# Patient Record
Sex: Male | Born: 1975
Health system: Southern US, Community
[De-identification: ages and names within clinical notes are randomized; demographics above are authoritative.]

## PROBLEM LIST (undated history)

## (undated) DIAGNOSIS — M109 Gout, unspecified: Secondary | ICD-10-CM

## (undated) DIAGNOSIS — M1990 Unspecified osteoarthritis, unspecified site: Secondary | ICD-10-CM

## (undated) DIAGNOSIS — E119 Type 2 diabetes mellitus without complications: Secondary | ICD-10-CM

## (undated) DIAGNOSIS — I10 Essential (primary) hypertension: Secondary | ICD-10-CM

## (undated) DIAGNOSIS — K5792 Diverticulitis of intestine, part unspecified, without perforation or abscess without bleeding: Secondary | ICD-10-CM

---

## 2005-02-03 ENCOUNTER — Emergency Department (HOSPITAL_COMMUNITY): Admission: EM | Admit: 2005-02-03 | Discharge: 2005-02-03 | Payer: Self-pay | Admitting: Emergency Medicine

## 2005-02-06 ENCOUNTER — Inpatient Hospital Stay (HOSPITAL_COMMUNITY): Admission: EM | Admit: 2005-02-06 | Discharge: 2005-02-10 | Payer: Self-pay | Admitting: Emergency Medicine

## 2005-02-11 ENCOUNTER — Ambulatory Visit: Payer: Self-pay | Admitting: Internal Medicine

## 2005-02-25 ENCOUNTER — Ambulatory Visit: Payer: Self-pay | Admitting: *Deleted

## 2006-07-17 ENCOUNTER — Emergency Department (HOSPITAL_COMMUNITY): Admission: EM | Admit: 2006-07-17 | Discharge: 2006-07-17 | Payer: Self-pay | Admitting: Emergency Medicine

## 2006-12-21 IMAGING — CT CT ABDOMEN W/O CM
2 of 3 series · 17 of 46 positions shown, 19 images · IV contrast (agent unspecified)
Comparison: none

CLINICAL DATA: Left flank pain radiating to groin.  Suspect ureteral calculus or renal obstruction.
 ABDOMEN CT WITHOUT CONTRAST:
TECHNIQUE: Multidetector CT imaging of the abdomen was performed following the standard protocol without IV contrast.
TECHNIQUE: Multidetector CT imaging of the pelvis was performed following the standard protocol without IV contrast.

[Series 2: stone_wo 5.0 b40f st · axial · 0.67mm/px · z∈[-460,-84]mm · 14 of 108 slices shown, 16 images]
[im 7/108  soft-tissue]
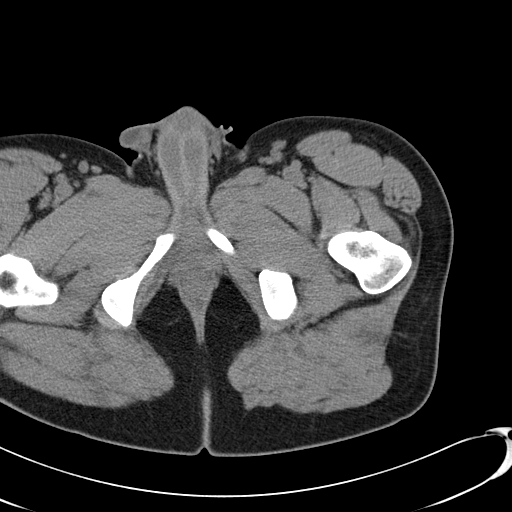
[im 7/108  bone]
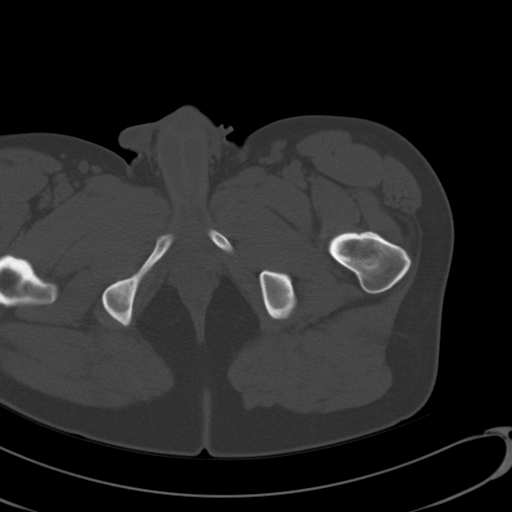
[im 14/108  soft-tissue]
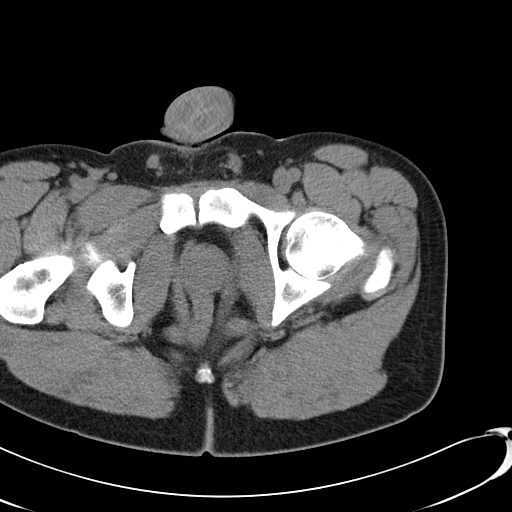
[im 21/108  soft-tissue]
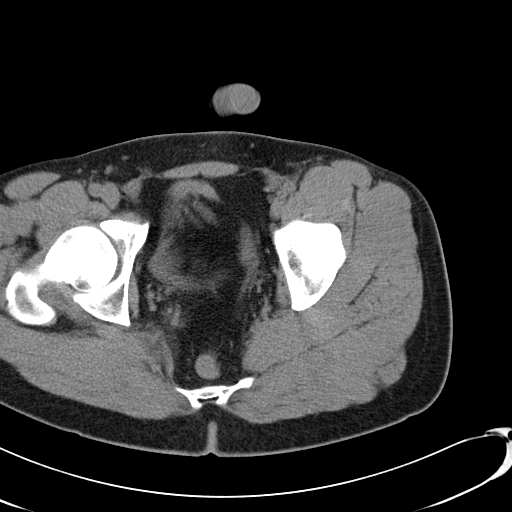
[im 28/108  soft-tissue]
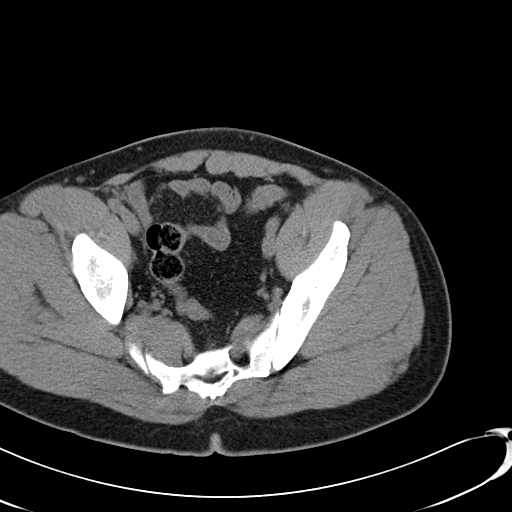
[im 35/108  soft-tissue]
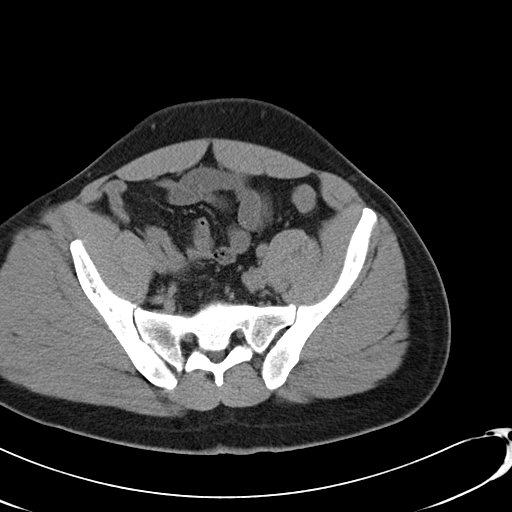
[im 42/108  soft-tissue]
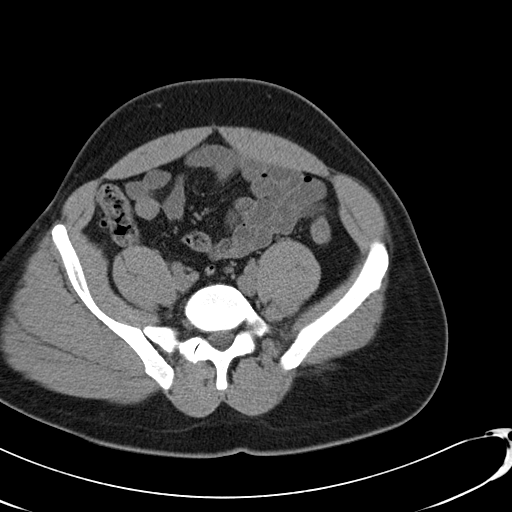
[im 49/108  soft-tissue]
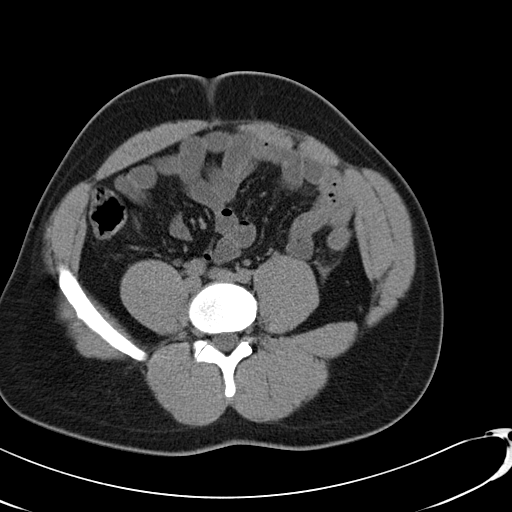
[im 59/108  soft-tissue]
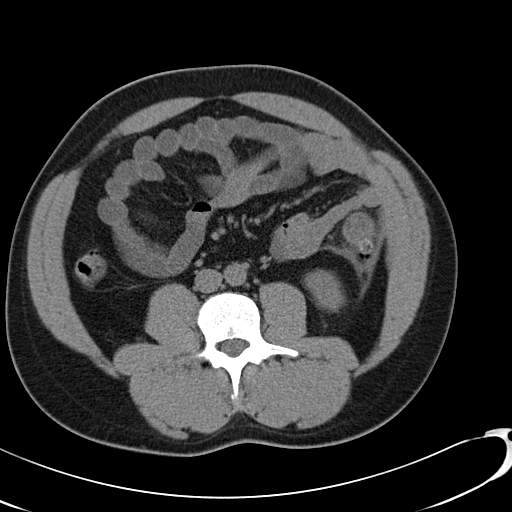
[im 66/108  soft-tissue]
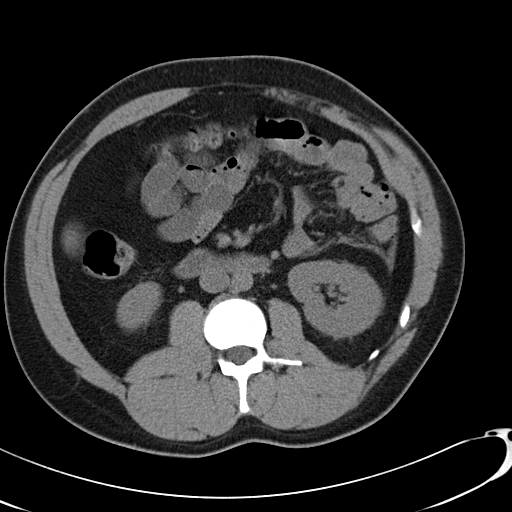
[im 66/108  bone]
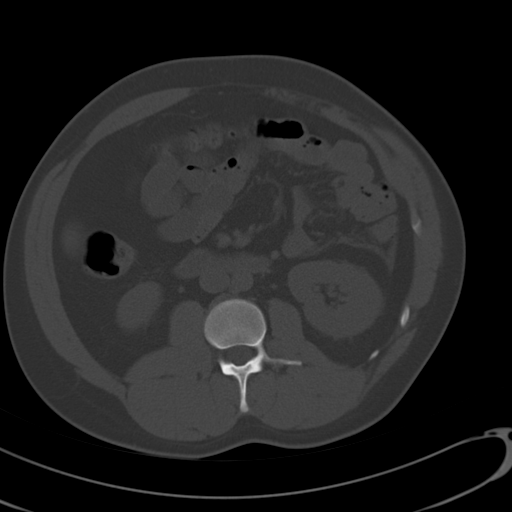
[im 73/108  soft-tissue]
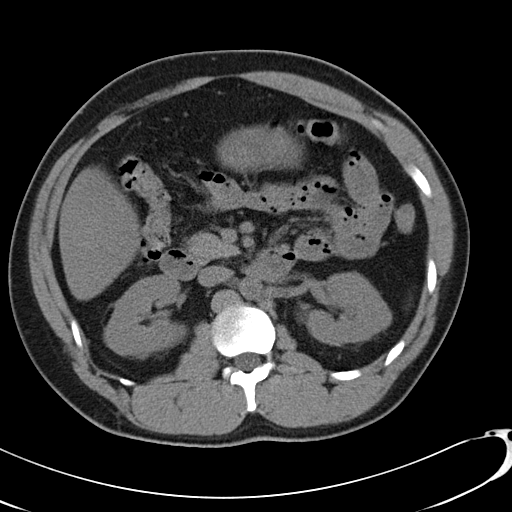
[im 80/108  soft-tissue]
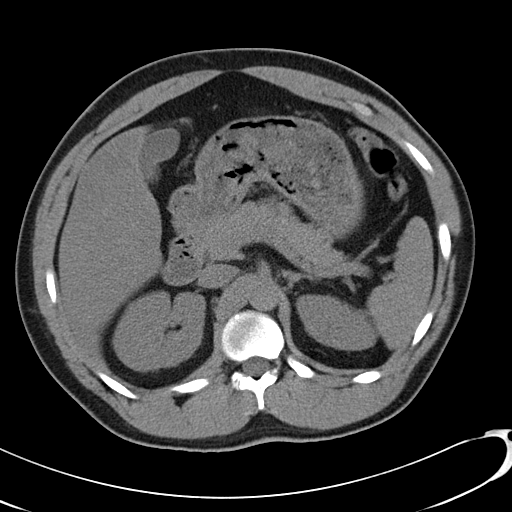
[im 87/108  soft-tissue]
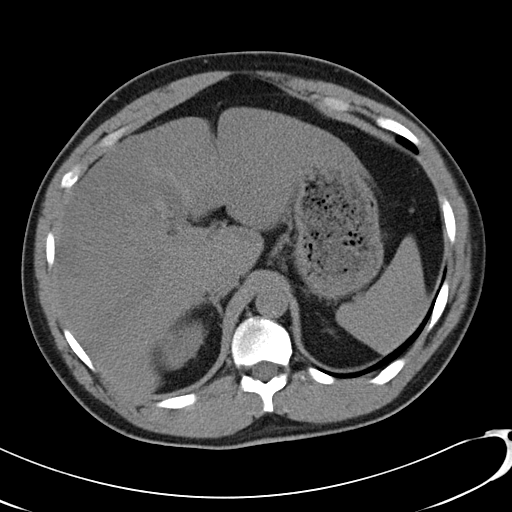
[im 94/108  soft-tissue]
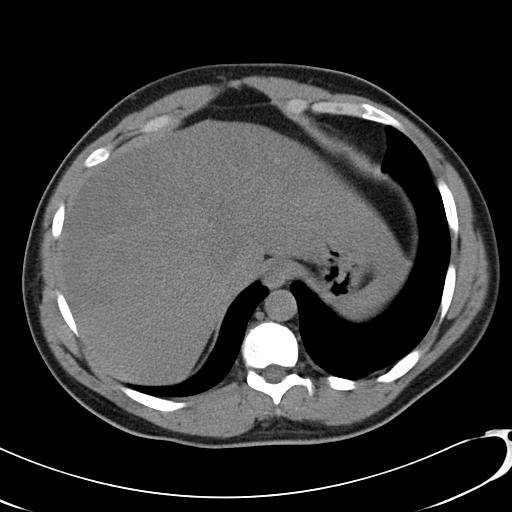
[im 101/108  soft-tissue]
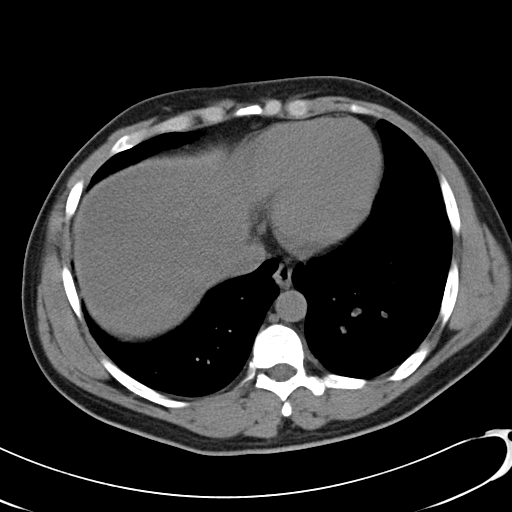

[Series 602: cor · coronal · 0.88mm/px · 3 of 50 slices shown]
[im 17/50  soft-tissue]
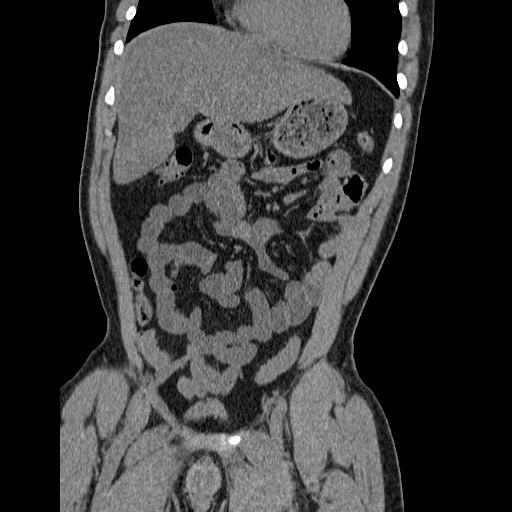
[im 22/50  soft-tissue]
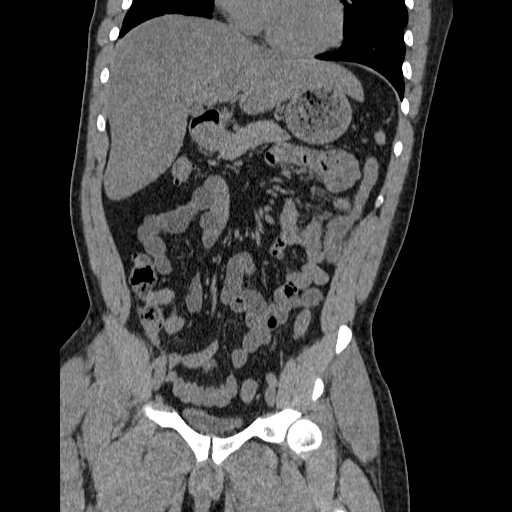
[im 28/50  soft-tissue]
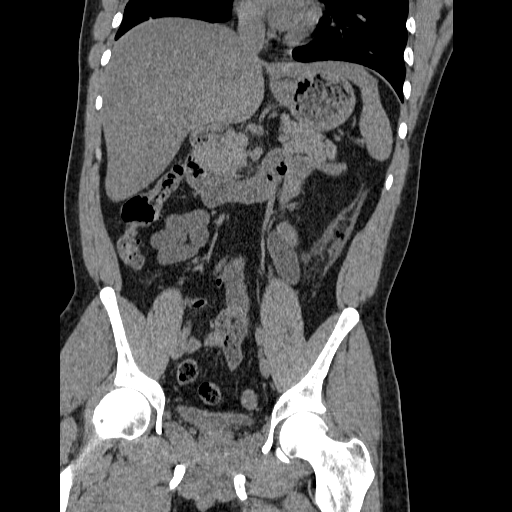

[17 of 46 positions shown; findings below may reference images not displayed]

FINDINGS: There is no evidence of renal calculi or hydronephrosis.  Both kidneys are normal in contour and there is no evidence of perinephric fluid or inflammatory changes.  Diffuse fatty infiltration of the liver is noted.  Other abdominal parenchymal organs are unremarkable in appearance on this non-contrast study.  
 Diverticulosis of the sigmoid colon is noted and there is moderate pericolonic inflammatory change along the course of the descending colon.  These findings are consistent with diverticulitis.  There is no evidence of abscess or free fluid.
IMPRESSION: 1.  Mild to moderate diverticulitis of the descending colon.  No evidence of abscess.  
 2.  No evidence for renal calculi or hydronephrosis.  
 3.  Diffuse fatty infiltration of the liver.  
 PELVIS CT WITHOUT CONTRAST:
FINDINGS: There is no evidence of ureteral calculi or dilatation.  No bowel wall thickening or inflammatory changes are seen in the pelvis.  There is no evidence of mass or adenopathy within the pelvis.
IMPRESSION: Negative non-contrast pelvis CT.  No evidence of ureteral calculi.  See abnormal abdomen CT report above.

## 2009-08-20 ENCOUNTER — Emergency Department (HOSPITAL_COMMUNITY): Admission: EM | Admit: 2009-08-20 | Discharge: 2009-08-20 | Payer: Self-pay | Admitting: Emergency Medicine

## 2010-07-11 NOTE — Consult Note (Signed)
NAMEKENDEL, BESSEY NO.:  [id number]   MEDICAL RECORD NO.:  [id number]          PATIENT TYPE:  INP   LOCATION:  1611                         FACILITY:  [facility]   PHYSICIAN:  Angelia Mould. Derrell Lolling, M.D.DATE OF BIRTH:  Feb 22, 1976   DATE OF CONSULTATION:  02/08/2005  DATE OF DISCHARGE:                                   CONSULTATION   REASON FOR CONSULTATION:  Left-sided colitis.   HISTORY OF PRESENT ILLNESS:  This is a 35 year old African-American man who  developed left-sided abdominal pain 5 days ago. He was seen in the emergency  room on December12, had a CT scan and was given a prescription for Cipro and  Flagyl and oxycodone and sent home. He states the pain persisted. He  continued to have nausea and vomiting. He continues to have nonbloody  stools. He was admitted on February 06, 2005 and placed on IV Cipro and  Flagyl. A CT scan was done yesterday which shows focal inflammatory change  in the distal descending colon with a small collection of extraluminal air  in the area but there is no abscess and no obstruction. He has remained  afebrile throughout his hospital course. He denies having any prior similar  episodes and denies any prior gastrointestinal complaints.   PAST HISTORY:  Asthma but no episodes in 10 years.   FAMILY HISTORY:  Father has a seizure problem, mother recently had a total  hip replacement, three brothers, three sisters, all healthy.   CURRENT MEDICATIONS:  Cipro and Flagyl.   ALLERGIES:  OXYCODONE   SOCIAL HISTORY:  He drinks an 18 pack on the weekend, smokes an ounce of  marijuana per week and smokes 1-2 cigarettes per day. Denies any intravenous  drugs. Denies any history of HIV, works as a Recruitment consultant.   REVIEW OF SYSTEMS:  All systems reviewed and noncontributory except as  described above.   PHYSICAL EXAM:  GENERAL:  Pleasant, thin, healthy-appearing young African-  American male in no distress.  VITAL SIGNS:   Temperature 98.7,  blood pressure 137/89, heart rate 63 and  regular, respiratory rate 18.  EYES:  Sclera clear. Extraocular movements intact.  ENT:  Nose, lips, tongue and oropharynx are without gross lesions. NECK:  Supple, nontender. No mass. No jugular distension.  LUNGS:  Clear to auscultation. No chest wall tenderness.  HEART:  Regular rate and rhythm. No murmur. Radial and femoral pulses are  palpable. No peripheral edema.  ABDOMEN:  Soft, not distended. He has localized tenderness in the left lower  quadrant actually more lateral in the left flank but there is no mass, and  no guarding. There is no hernia noted. Liver and spleen not enlarged.  GENITOURINARY:  Penis, scrotum and testes are normal. EXTREMITIES:  Reveals  all four extremities well without pain or deformity.  NEUROLOGIC:  No gross motor sensory deficits.   LABORATORY WORK:  White blood cell count was 13,000 on admission and is now  down to 8200, hemoglobin 12.8 Chest x-ray shows no active disease. Liver  function test normal.   ASSESSMENT:  1.  Presumed acute diverticulitis of the  distal descending colon. Other      etiologies including cancer, inflammatory bowel disease, and foreign      body perforation are possible, felt to be much less likely.  2.  He seems to be slowly responding to medical therapy with bowel rest and      IV antibiotics.   PLAN:  I would continue nonoperative management at this point since there  does not appear to be any surgical complication. I would continue bowel  rest, n.p.o. except for sips. IV antibiotics. I will plan to follow-up  colonoscopy in about 1 month.      Angelia Mould. Derrell Lolling, M.D.  Electronically Signed     HMI/MEDQ  D:  02/08/2005  T:  02/10/2005  Job:  [id number]   cc:   Hettie Holstein, D.O.

## 2010-07-11 NOTE — H&P (Signed)
NAMEVEER, ELAMIN            ACCOUNT NO.:  [id number]   MEDICAL RECORD NO.:  [id number]          PATIENT TYPE:  INP   LOCATION:  1611                         FACILITY:  [facility]   PHYSICIAN:  Jonna L. Robb Matar, M.D.DATE OF BIRTH:  30-May-1975   DATE OF ADMISSION:  02/06/2005  DATE OF DISCHARGE:                                HISTORY & PHYSICAL   CHIEF COMPLAINT:  Vomiting.   PRIMARY CARE PHYSICIAN:  Unassigned.   HISTORY:  This 35 year old African American male has had a three-day history  of abdominal pain.  He was seen here, on February 03, 2005, with left lower  quadrant pain, put on Cipro, Flagyl, and oxycodone.  He notes he was  allergic to the oxycodone because he took it and he broke out in hives.  The  nausea and vomiting restarted yesterday and the pain is no better at all.  He has had non-bloody vomitus.  No hematemesis, melena, or bright red blood.  He has vomited about 5-6 times in the last 24 hours.  Ate corn on the cob  for the couple of days prior to this episode.  By now, he is noticing that  he is getting weak and his urine is a getting darker color.  In the  emergency room, he has received Dilaudid with Zofran.   PAST MEDICAL HISTORY:  Asthma, but he has not had any episodes in over 10  years.   FAMILY HISTORY:  His father has a seizure problem.  His mother has recently  had a total hip replacement.  He has three brothers, three sisters are all  healthy.   OPERATIONS:  None.   SOCIAL HISTORY:  He drinks an 18-pack on weekends.  Smokes an ounce of  marijuana per week.  He smokes 1-2 cigarettes per day.  He works as a  Recruitment consultant.   ALLERGIES:  1.  OXYCODONE.  2.  ALUPENT.   MEDICATIONS:  1.  Cipro 500 b.i.d.  2.  Flagyl 500 b.i.d.   REVIEW OF SYSTEMS:  As noted he has been thirsty, a little bit weak, dark  urine.  Denies any coughing, wheezing, chest pain, seizures, headaches,  blurred vision, kidney problems, no edema.   PHYSICAL  EXAMINATION:  VITAL SIGNS:  Temp 99.5, pulse 93, respirations 20,  blood pressure 130/85.  GENERAL:  A well developed male in moderate abdominal pain.  HEENT:  Conjunctivae and lids are normal.  Pupils reactive, slightly  constricted.  Extraocular movements are full.  Normal hearing, mucosa, and  pharynx.  There is no mass, thyromegaly, carotid bruits.  RESPIRATORY:  Effort is normal.  The lungs are clear to A&P without  wheezing, rales, rhonchi, or dullness.  HEART:  Regular rate and rhythm.  Normal S1 and S2 without murmurs, rubs, or  gallops.  There are no bruits, cyanosis, clubbing, or edema.  ABDOMEN:  Very tender in the left lower quadrant with some guarding.  No  rebound.  No hepatosplenomegaly or hernia.  Bowel sounds are positive.  GENITOURINARY:  Normal scrotum and penis.  LYMPHATICS:  No cervical or inguinal adenopathy.  MUSCULOSKELETAL:  Muscle  strength is 5/5 with full range of motion all four  extremities.  SKIN:  Shows no rash, lesions, or nodules.  NEUROLOGIC:  Cranial nerves are intact.  DTRs are 3+ and equal.  Normal  sensation.  He is alert and oriented x3.  Normal memory, judgment, and  affect.   LABORATORY WORK:  Shows a white count of 13,000, 87% neutrophils.  Sodium  132, BUN 12, creatinine 1.1, albumin 3.3.  Normal LFTs.  Chest x-ray:  No  acute disease.  CT, from February 03, 2005, shows moderate diverticulitis in  the descending colon, fatty infiltrate in the liver.  Pelvic CT was  negative.   1.  Acute diverticulitis.  I am going to put him on Primaxin and Flagyl,      NPO, intravenous fluids, and recheck his CT in the morning to rule out      abscess.  2.  Fatty liver.  This is probably secondary to alcohol.  I discussed      cessation with the patient.  3.  Marijuana abuse.      Jonna L. Robb Matar, M.D.  Electronically Signed     JLB/MEDQ  D:  02/06/2005  T:  02/06/2005  Job:  [id number]

## 2010-07-11 NOTE — Discharge Summary (Signed)
Howard Bird, Howard Bird            ACCOUNT NO.:  [id number]   MEDICAL RECORD NO.:  [id number]          PATIENT TYPE:  INP   LOCATION:  1611                         FACILITY:  [facility]   PHYSICIAN:  Mobolaji B. Bakare, M.D.DATE OF BIRTH:  02-08-1976   DATE OF ADMISSION:  02/06/2005  DATE OF DISCHARGE:  02/10/2005                                 DISCHARGE SUMMARY   PRIMARY CARE PHYSICIAN:  Unassigned.  The patient will be following up at  [facility].   CONSULTS:  Surgical consult, Dr. Derrell Lolling.   FINAL DIAGNOSES:  1.  Acute diverticulitis involving distal descending colon.  The patient      will require follow-up colonoscopy in 6 weeks.  2.  Fatty liver, probably secondary to alcohol abuse.  The patient was      counseled on cessation.  3.  Marijuana abuse.   SECONDARY DIAGNOSIS:  History of asthma.   PROCEDURES:  1.  Abdominal x-ray showed no acute findings.  2.  CT scan of the abdomen done on February 07, 2005 showed mild worsening      of descending colon diverticulitis, with new extraluminal air bubbles      noted.  No evidence of drainable abscess.  3.  Pelvic CT scan with contrast showed diverticulosis in the sigmoid colon      without evidence of diverticulitis.  No evidence of inflammatory process      or abnormal fluid collections.   BRIEF HISTORY:  Howard Bird is a 35 year old, African-American male who  initially presented to the emergency department on February 03, 2005 with  left lower quadrant pain.  He was then put on Cipro, Flagyl, and oxycodone,  and for 2 days he could not tolerate these antibiotics.  He had nausea,  vomiting, and this was probably secondary to the oxycodone.  In any case, he  re-presented to the emergency room after having failed outpatient treatment  for mild to moderate diverticulitis involving the descending colon which was  noted on CT scan of abdomen done on February 03, 2005.   On reevaluation in the emergency room on February 06, 2005,  the patient  had  a low-grade temperature of 99.5; pulse of 93; and blood pressure of 130/85.  Hence, he was admitted for IV antibiotics.   HOSPITAL COURSE:  He was empirically started on Primaxin and Flagyl IV.  He  was placed on IV fluid.  Surgical consult was obtained, and it was felt that  conservative management is appropriate.  White cell count improved from  13,000 to 8.2.  The patient remained afebrile.  The patient improved  clinically, and was having loose stools without blood.  He was started on  clear liquids and subsequently advanced as tolerated.  He tolerated his full  liquid diet.  He will be tried today on a low-residue diet, and if the  patient continues to tolerate it he will be discharged home to follow up at  [facility].   CONDITION AT DISCHARGE:  Clinically improved and stable.  There was no  abdominal pain or tenderness.   DISCHARGE MEDICATIONS:  1.  Ciprofloxacin 500  mg b.i.d. to complete antibiotics for 10 days.  2.  Flagyl 500 mg t.i.d. to complete antibiotics for 10 days.  3.  Tylenol p.r.n. for pain.  4.  Alupent inhaler p.r.n.  5.  Phenergan 12.5 mg q.4 h. p.r.n. as needed for nausea.   FOLLOWUP:  At [facility].   RECOMMENDATIONS:  Colonoscopy in 6 weeks.      Mobolaji B. Corky Downs, M.D.  Electronically Signed     MBB/MEDQ  D:  02/10/2005  T:  02/10/2005  Job:  [id number]   cc:   Angelia Mould. Derrell Lolling, M.D.  [address]  [place]  [place] [address]

## 2011-09-28 ENCOUNTER — Encounter (HOSPITAL_COMMUNITY): Payer: Self-pay | Admitting: *Deleted

## 2011-09-28 ENCOUNTER — Emergency Department (HOSPITAL_COMMUNITY)
Admission: EM | Admit: 2011-09-28 | Discharge: 2011-09-28 | Disposition: A | Discharge status: Home / Self Care | Payer: Self-pay | Attending: Emergency Medicine | Admitting: Emergency Medicine

## 2011-09-28 DIAGNOSIS — F17200 Nicotine dependence, unspecified, uncomplicated: Secondary | ICD-10-CM | POA: Insufficient documentation

## 2011-09-28 DIAGNOSIS — Z885 Allergy status to narcotic agent status: Secondary | ICD-10-CM | POA: Insufficient documentation

## 2011-09-28 DIAGNOSIS — M659 Synovitis and tenosynovitis, unspecified: Secondary | ICD-10-CM | POA: Insufficient documentation

## 2011-09-28 DIAGNOSIS — M7750 Other enthesopathy of unspecified foot: Secondary | ICD-10-CM

## 2011-09-28 DIAGNOSIS — M65979 Unspecified synovitis and tenosynovitis, unspecified ankle and foot: Secondary | ICD-10-CM | POA: Insufficient documentation

## 2011-09-28 HISTORY — DX: Diverticulitis of intestine, part unspecified, without perforation or abscess without bleeding: K57.92

## 2011-09-28 MED ORDER — TRAMADOL HCL 50 MG PO TABS: 50.0000 mg | ORAL_TABLET | Freq: Four times a day (QID) | ORAL | Status: AC | PRN

## 2011-09-28 MED ORDER — KETOROLAC TROMETHAMINE 60 MG/2ML IM SOLN
60.0000 mg | Freq: Once | INTRAMUSCULAR | Status: AC
  Administered 2011-09-28: 60 mg via INTRAMUSCULAR
  Filled 2011-09-28: qty 2

## 2011-09-28 NOTE — ED Provider Notes (Signed)
History     CSN: [id number]  Arrival date & time 09/28/11  0543   First MD Initiated Contact with Patient 09/28/11 [phone]      Chief Complaint  Patient presents with  . Ankle Pain    (Consider location/radiation/quality/duration/timing/severity/associated sxs/prior treatment) HPI Comments: Howard Bird 36 y.o. male   The chief complaint is: Patient presents with:   Ankle Pain   The patient has medical history significant for:   Past Medical History:   Diverticulitis                                              The onset of the symptoms was  gradual starting 10 hours ago,  The Course is  persistent, gradually worsened Movement, walking, pressure, palpation makes symptoms worse, rest makes symptoms better Has associated swelling Denies denies headache, changes in vision, CP, SOB, abdominal pain.       The history is provided by the patient, the spouse and medical records. History Limited By: none.   Howard Bird is a 36 y.o. male presents to the emergency room c/o 12 hrs of severe R ankle pain.  States he drove 1200 miles in the past 2 days without  cruise control.  Arrived home Sunday afternoon and his ankle was sore, but he was able to walk and use it.   The pain became intense overnight and he is currently unable to weight bear 2/2 pain.  He has associated swelling in the posterior R ankle.  The pain is throbbing, isolated to the ankle without radiation and rated at a 10/10.  He denies trauma, falling or other possible injury.  He also denies fever, chills, headache, changes in vision, CP, SOB, abdominal pain.  He has no Hx of clotting disorders or problems.  He has tried an ace bandage without relief.    Past Medical History  Diagnosis Date  . Diverticulitis     History reviewed. No pertinent past surgical history.  History reviewed. No pertinent family history.  History  Substance Use Topics  . Smoking status: Current Everyday Smoker -- 0.5 packs/day  .  Smokeless tobacco: Not on file  . Alcohol Use: Yes      Review of Systems  Respiratory: Negative for chest tightness, shortness of breath and wheezing.   Cardiovascular: Negative for chest pain.  Musculoskeletal: Positive for joint swelling and gait problem. Negative for back pain.  Skin: Negative for rash and wound.  Neurological: Negative for numbness.    Allergies  Oxycontin  Home Medications   Current Outpatient Rx  Name Route Sig Dispense Refill  . NAPROXEN SODIUM 220 MG PO TABS Oral Take 220 mg by mouth 2 (two) times daily as needed. For pain      BP 148/110  Pulse 108  Temp 97.8 F (36.6 C) (Oral)  Resp 16  SpO2 98%  Physical Exam  Nursing note and vitals reviewed. Constitutional: He appears well-developed and well-nourished. No distress.  HENT:  Head: Normocephalic and atraumatic.  Eyes: Conjunctivae are normal.  Cardiovascular: Normal rate, regular rhythm and intact distal pulses.        Capillary refill less than 3 seconds Intact distal pulses  Pulmonary/Chest: Effort normal and breath sounds normal.  Musculoskeletal: He exhibits edema (in the posterior ankle) and tenderness.       Right ankle: He exhibits decreased range of motion and  swelling. He exhibits no ecchymosis, no deformity, no laceration and normal pulse. Achilles tendon exhibits pain. Achilles tendon exhibits no defect.       ROM: Significantly decreased secondary to pain  Neurological: He is alert. Coordination normal.       Sensation intact Strength decreased in R ankle secondary to pain  Skin: Skin is warm and dry. No rash noted. He is not diaphoretic. No erythema. No pallor.       No erythema or increased warmth    ED Course  Procedures (including critical care time)  Labs Reviewed - No data to display No results found.   1. Tendonitis of ankle       MDM  Dave Mergen presents after a long trip with ankle pain.  Based on his extensive driving in a short period of time and  the lack of injury I believe this is a tendonitis.  He has no symptoms of a blood clot or PE.  I believe he has strained his solius muscle or gastroc muscle.  I have recommended NSAIDs during the day and I will prescribe Norco at night.  I will also give crutches so that he can attend work today.  Follow-up with the ER if swelling extends up the the leg or he develops shortness of breath.  I have discussed indications to return immediately to the ER and they have expressed understanding.    1. Medications: Ultram 2. Treatment: NSAIDs during the day, Ultram at night 3. Follow Up: with ortho or ED if symptoms persist        Dierdre Forth, PA-C 09/28/11 0730

## 2011-09-28 NOTE — ED Notes (Addendum)
Recently drove to [place] and back over 2d, returned yesterday, c/o R ankle pain, denies injury, "thinks the continual flexion/extension of ankle inflamed area", pain onset 1500 yesterday, worse since , "now cannot walk on it". CMS intact. Pinpoints pain from R fore foot to lower shin/calf

## 2011-09-28 NOTE — ED Provider Notes (Signed)
Medical screening examination/treatment/procedure(s) were conducted as a shared visit with non-physician practitioner(s) and myself.  I personally evaluated the patient during the encounter  Pt with normal vitals, did drive a long distance to [place] and back within 72 hours, has pain isolated to insertion of gastroc and soleus muscles of right lower leg in posterior area only, non visible edema, not pitting, good pulse, no rash, tender along tendon only suggesting severe tendonitis after overuse from pushing on accelerator and brake.  NSAIDs, analgesics, RICE.  Crutches for helping to ambulate.    Gavin Pound. Cleon Signorelli, MD 09/28/11 4098

## 2014-01-03 ENCOUNTER — Emergency Department (HOSPITAL_COMMUNITY)
Admission: EM | Admit: 2014-01-03 | Discharge: 2014-01-03 | Disposition: A | Discharge status: Home / Self Care | Payer: 59 | Attending: Emergency Medicine | Admitting: Emergency Medicine

## 2014-01-03 ENCOUNTER — Emergency Department (HOSPITAL_COMMUNITY): Payer: 59

## 2014-01-03 ENCOUNTER — Encounter (HOSPITAL_COMMUNITY): Payer: Self-pay | Admitting: *Deleted

## 2014-01-03 DIAGNOSIS — Z8719 Personal history of other diseases of the digestive system: Secondary | ICD-10-CM | POA: Insufficient documentation

## 2014-01-03 DIAGNOSIS — Z720 Tobacco use: Secondary | ICD-10-CM | POA: Diagnosis not present

## 2014-01-03 DIAGNOSIS — M79671 Pain in right foot: Secondary | ICD-10-CM

## 2014-01-03 DIAGNOSIS — M25462 Effusion, left knee: Secondary | ICD-10-CM

## 2014-01-03 DIAGNOSIS — M25474 Effusion, right foot: Secondary | ICD-10-CM | POA: Diagnosis not present

## 2014-01-03 DIAGNOSIS — Z791 Long term (current) use of non-steroidal anti-inflammatories (NSAID): Secondary | ICD-10-CM | POA: Insufficient documentation

## 2014-01-03 DIAGNOSIS — M25562 Pain in left knee: Secondary | ICD-10-CM | POA: Diagnosis present

## 2014-01-03 MED ORDER — MELOXICAM 7.5 MG PO TABS: ORAL_TABLET | ORAL | Status: AC

## 2014-01-03 MED ORDER — TRAMADOL HCL 50 MG PO TABS: 50.0000 mg | ORAL_TABLET | Freq: Four times a day (QID) | ORAL | Status: AC | PRN

## 2014-01-03 NOTE — ED Provider Notes (Signed)
CSN: [id number]     Arrival date & time 01/03/14  0805 History  This chart was scribed for non-physician practitioner, Noland Fordyce, PA-C, working with Pamella Pert, MD by Ladene Artist, ED Scribe. This patient was seen in room TR06C/TR06C and the patient's care was started at 9:03 AM.   Chief Complaint  Patient presents with  . Knee Pain  . Foot Pain   The history is provided by the patient. No language interpreter was used.   HPI Comments: Howard Bird is a 38 y.o. male who presents to the Emergency Department complaining of constant, gradually worsening L knee pain onset 5 days ago. Pt describes the pain as sharp and aching sensation. He rates the pain 7-10 at rest and 10/10 with standing. Pt states that he was able to ambulate with a limp a few days ago but now he uses crutches. 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  He reports associated L knee swelling, R foot pain and R foot swelling onset 2 days ago. He denies numbness/tingling, fever, nausea, vomiting. No h/o gout. Allergy to Oxycontin. No PCP.  Past Medical History  Diagnosis Date  . Diverticulitis    History reviewed. No pertinent past surgical history. History reviewed. No pertinent family history. History  Substance Use Topics  . Smoking status: Current Every Day Smoker -- 0.50 packs/day  . Smokeless tobacco: Not on file  . Alcohol Use: Yes    Review of Systems  Constitutional: Negative for fever.  Gastrointestinal: Negative for nausea and vomiting.  Musculoskeletal: Positive for myalgias, joint swelling and arthralgias.  Neurological: Negative for numbness.  All other systems reviewed and are negative.  Allergies  Oxycontin  Home Medications   Prior to Admission medications   Medication Sig Start Date End Date Taking? Authorizing Provider  naproxen sodium (ANAPROX) 220 MG tablet Take 220 mg by mouth 2 (two) times daily as needed. For pain    Historical Provider, MD   Triage Vitals: BP 135/99 mmHg   Pulse 124  Temp(Src) 98.6 F (37 C) (Oral)  Resp 24  SpO2 100% Physical Exam  Constitutional: He is oriented to person, place, and time. He appears well-developed and well-nourished.  HENT:  Head: Normocephalic and atraumatic.  Eyes: EOM are normal.  Neck: Normal range of motion.  Cardiovascular: Normal rate.   Pulses:      Dorsalis pedis pulses are 2+ on the right side, and 2+ on the left side.  Pulmonary/Chest: Effort normal.  Musculoskeletal: Normal range of motion.  L knee Mild edema to medial aspect with tenderness  Full ROM No calf tenderness R foot Moderate edema Tenderness to dorsal aspect No ecchymosis, erythema or warmth Full ROM of ankle and toes  Neurological: He is alert and oriented to person, place, and time.  Antalgic gait  Skin: Skin is warm and dry.  Psychiatric: He has a normal mood and affect. His behavior is normal.  Nursing note and vitals reviewed.  ED Course  Procedures (including critical care time) DIAGNOSTIC STUDIES: Oxygen Saturation is 100% on RA, normal by my interpretation.    COORDINATION OF CARE: 9:09 AM-Discussed treatment plan which includes XRs, anti-inflammatories, medication for pain with pt at bedside and pt agreed to plan.   Labs Review Labs Reviewed - No data to display  Imaging Review Dg Knee Complete 4 Views Left  01/03/2014   CLINICAL DATA:  Left knee joint swelling  EXAM: LEFT KNEE - COMPLETE 4+ VIEW  COMPARISON:  None.  FINDINGS: There is  no evidence of fracture, dislocation, or joint effusion. There is no evidence of arthropathy or other focal bone abnormality. Soft tissues are unremarkable.  IMPRESSION: No acute osseous finding   Electronically Signed   By: Daryll Brod M.D.   On: 01/03/2014 08:59   Dg Foot Complete Right  01/03/2014   CLINICAL DATA:  Pain and swelling along metatarsals ; pain with weight-bearing  EXAM: RIGHT FOOT COMPLETE - 3+ VIEW  COMPARISON:  None.  FINDINGS: Frontal, oblique, and lateral views were  obtained. There is mild swelling dorsally. There is no fracture or dislocation. Joint spaces appear intact. No erosive change.  IMPRESSION: Mild swelling dorsally. No fracture or dislocation. No appreciable arthropathy.   Electronically Signed   By: Lowella Grip M.D.   On: 01/03/2014 09:00     EKG Interpretation None      MDM   Final diagnoses:  Swelling of joint of left knee    Pt c/o left knee pain and right foot pain with swelling. Left knee pain started 5 days ago, no known injury. Left knee: no evidence of underlying infection.  FROM Left calf-no tenderness. Leg neurovasculaly in tact. Plain films: no acute osseous finding. Knee sleeve provided. Right foot: moderate edema w/o evidence of underlying infection. Neurovascularly in tact. Plain films: mild swelling dorsally. No fracture or dislocation.  Will tx with ace wrap. Home care instructions including RICE therapy provided. Advised to f/u with PCP in 1 week for recheck of symptoms if not improving. Pt verbalized understanding and agreement with tx plan.  I personally performed the services described in this documentation, which was scribed in my presence. The recorded information has been reviewed and is accurate.    Noland Fordyce, PA-C 01/03/14 [place], MD 01/03/14 [phone]

## 2014-01-03 NOTE — ED Notes (Signed)
Pt reports onset of left knee pain and swelling on Saturday, then onset of right foot pain since Monday. Denies any injury. Ambulatory at triage on crutches.

## 2014-01-05 ENCOUNTER — Emergency Department (HOSPITAL_COMMUNITY)
Admission: EM | Admit: 2014-01-05 | Discharge: 2014-01-05 | Disposition: A | Discharge status: Home / Self Care | Payer: 59 | Attending: Emergency Medicine | Admitting: Emergency Medicine

## 2014-01-05 ENCOUNTER — Emergency Department (HOSPITAL_COMMUNITY): Payer: 59

## 2014-01-05 ENCOUNTER — Encounter (HOSPITAL_COMMUNITY): Payer: Self-pay | Admitting: Emergency Medicine

## 2014-01-05 DIAGNOSIS — Z720 Tobacco use: Secondary | ICD-10-CM | POA: Diagnosis not present

## 2014-01-05 DIAGNOSIS — Z791 Long term (current) use of non-steroidal anti-inflammatories (NSAID): Secondary | ICD-10-CM | POA: Diagnosis not present

## 2014-01-05 DIAGNOSIS — K5732 Diverticulitis of large intestine without perforation or abscess without bleeding: Secondary | ICD-10-CM | POA: Diagnosis not present

## 2014-01-05 DIAGNOSIS — R1031 Right lower quadrant pain: Secondary | ICD-10-CM

## 2014-01-05 LAB — CBC WITH DIFFERENTIAL/PLATELET
BASOS ABS: 0 10*3/uL (ref 0.0–0.1)
BASOS PCT: 0 % (ref 0–1)
EOS ABS: 0 10*3/uL (ref 0.0–0.7)
Eosinophils Relative: 0 % (ref 0–5)
HCT: 37.9 % — ABNORMAL LOW (ref 39.0–52.0)
Hemoglobin: 13 g/dL (ref 13.0–17.0)
Lymphocytes Relative: 20 % (ref 12–46)
Lymphs Abs: 2.7 10*3/uL (ref 0.7–4.0)
MCH: 30.5 pg (ref 26.0–34.0)
MCHC: 34.3 g/dL (ref 30.0–36.0)
MCV: 89 fL (ref 78.0–100.0)
Monocytes Absolute: 1.1 10*3/uL — ABNORMAL HIGH (ref 0.1–1.0)
Monocytes Relative: 8 % (ref 3–12)
NEUTROS ABS: 9.4 10*3/uL — AB (ref 1.7–7.7)
NEUTROS PCT: 72 % (ref 43–77)
PLATELETS: 270 10*3/uL (ref 150–400)
RBC: 4.26 MIL/uL (ref 4.22–5.81)
RDW: 12.6 % (ref 11.5–15.5)
WBC: 13.3 10*3/uL — ABNORMAL HIGH (ref 4.0–10.5)

## 2014-01-05 LAB — URINALYSIS, ROUTINE W REFLEX MICROSCOPIC
Bilirubin Urine: NEGATIVE
GLUCOSE, UA: NEGATIVE mg/dL
HGB URINE DIPSTICK: NEGATIVE
Ketones, ur: NEGATIVE mg/dL
Leukocytes, UA: NEGATIVE
Nitrite: NEGATIVE
PH: 6 (ref 5.0–8.0)
Protein, ur: NEGATIVE mg/dL
SPECIFIC GRAVITY, URINE: 1.021 (ref 1.005–1.030)
UROBILINOGEN UA: 1 mg/dL (ref 0.0–1.0)

## 2014-01-05 LAB — COMPREHENSIVE METABOLIC PANEL
ALBUMIN: 3.9 g/dL (ref 3.5–5.2)
ALK PHOS: 97 U/L (ref 39–117)
ALT: 84 U/L — AB (ref 0–53)
ANION GAP: 18 — AB (ref 5–15)
AST: 66 U/L — AB (ref 0–37)
BUN: 11 mg/dL (ref 6–23)
CHLORIDE: 98 meq/L (ref 96–112)
CO2: 26 mEq/L (ref 19–32)
Calcium: 10.7 mg/dL — ABNORMAL HIGH (ref 8.4–10.5)
Creatinine, Ser: 0.84 mg/dL (ref 0.50–1.35)
GFR calc Af Amer: >90 mL/min (ref >90)
GFR calc non Af Amer: >90 mL/min (ref >90)
Glucose, Bld: 87 mg/dL (ref 70–99)
POTASSIUM: 4.4 meq/L (ref 3.7–5.3)
SODIUM: 142 meq/L (ref 137–147)
TOTAL PROTEIN: 9.4 g/dL — AB (ref 6.0–8.3)
Total Bilirubin: 0.6 mg/dL (ref 0.3–1.2)

## 2014-01-05 LAB — LIPASE, BLOOD: Lipase: 25 U/L (ref 11–59)

## 2014-01-05 MED ORDER — CIPROFLOXACIN HCL 500 MG PO TABS: 500.0000 mg | ORAL_TABLET | Freq: Two times a day (BID) | ORAL | Status: DC

## 2014-01-05 MED ORDER — HYDROCODONE-ACETAMINOPHEN 5-325 MG PO TABS: 1.0000 | ORAL_TABLET | ORAL | Status: AC | PRN

## 2014-01-05 MED ORDER — ONDANSETRON HCL 4 MG/2ML IJ SOLN
4.0000 mg | Freq: Once | INTRAMUSCULAR | Status: AC
  Administered 2014-01-05: 4 mg via INTRAVENOUS
  Filled 2014-01-05: qty 2

## 2014-01-05 MED ORDER — METRONIDAZOLE 500 MG PO TABS: 500.0000 mg | ORAL_TABLET | Freq: Two times a day (BID) | ORAL | Status: DC

## 2014-01-05 MED ORDER — ONDANSETRON HCL 4 MG PO TABS: 4.0000 mg | ORAL_TABLET | Freq: Four times a day (QID) | ORAL | Status: AC

## 2014-01-05 MED ORDER — ACETAMINOPHEN 325 MG PO TABS
650.0000 mg | ORAL_TABLET | Freq: Once | ORAL | Status: AC
  Administered 2014-01-05: 650 mg via ORAL
  Filled 2014-01-05: qty 2

## 2014-01-05 MED ORDER — SODIUM CHLORIDE 0.9 % IV BOLUS (SEPSIS)
1000.0000 mL | Freq: Once | INTRAVENOUS | Status: AC
  Administered 2014-01-05: 1000 mL via INTRAVENOUS

## 2014-01-05 MED ORDER — IOHEXOL 300 MG/ML  SOLN
100.0000 mL | Freq: Once | INTRAMUSCULAR | Status: AC | PRN
  Administered 2014-01-05: 100 mL via INTRAVENOUS

## 2014-01-05 MED ORDER — MORPHINE SULFATE 4 MG/ML IJ SOLN
4.0000 mg | Freq: Once | INTRAMUSCULAR | Status: AC
  Administered 2014-01-05: 4 mg via INTRAVENOUS
  Filled 2014-01-05: qty 1

## 2014-01-05 MED ORDER — HYDROMORPHONE HCL 1 MG/ML IJ SOLN
1.0000 mg | Freq: Once | INTRAMUSCULAR | Status: AC
  Administered 2014-01-05: 1 mg via INTRAVENOUS
  Filled 2014-01-05: qty 1

## 2014-01-05 NOTE — ED Notes (Signed)
Able to tolerated PO fluid intake/2 cups.

## 2014-01-05 NOTE — ED Provider Notes (Signed)
CSN: [id number]     Arrival date & time 01/05/14  1753 History   First MD Initiated Contact with Patient 01/05/14 1812     Chief Complaint  Patient presents with  . RLQ pain      (Consider location/radiation/quality/duration/timing/severity/associated sxs/prior Treatment) The history is provided by the patient and medical records.    This is a 38 y.o. M with PMH significant for diverticulitis, presenting to the ED for light low quadrant abdominal pain. Patient states he has been experiencing pain for the past 3 days, described as sharp without radiation.  He notes fever (highest reported was 50F), decreased appetite, but denies nausea, vomiting, diarrhea.  No urinary sx.  Patient denies prior abdominal surgeries.  Patient does have hx of diverticulitis.  Per patient, colonoscopy performed last year by Dr. Benson Norway.  Patient febrile and tachycardia on arrival, BP stable.  Past Medical History  Diagnosis Date  . Diverticulitis    History reviewed. No pertinent past surgical history. No family history on file. History  Substance Use Topics  . Smoking status: Current Every Day Smoker -- 0.50 packs/day  . Smokeless tobacco: Not on file  . Alcohol Use: Yes    Review of Systems  Constitutional: Positive for appetite change.  Gastrointestinal: Positive for abdominal pain.  All other systems reviewed and are negative.     Allergies  Oxycontin  Home Medications   Prior to Admission medications   Medication Sig Start Date End Date Taking? Authorizing Provider  meloxicam (MOBIC) 7.5 MG tablet Take 1-2 tablets daily for pain and swelling for 5 days, then daily as needed for pain. 01/03/14  Yes Noland Fordyce, PA-C  naproxen sodium (ANAPROX) 220 MG tablet Take 220 mg by mouth 2 (two) times daily as needed. For pain   Yes Historical Provider, MD  Pseudoeph-Doxylamine-DM-APAP (NYQUIL PO) Take 1 capsule by mouth at bedtime as needed (sleep).   Yes Historical Provider, MD  traMADol (ULTRAM) 50  MG tablet Take 1 tablet (50 mg total) by mouth every 6 (six) hours as needed. 01/03/14  Yes Noland Fordyce, PA-C   BP 144/101 mmHg  Pulse 125  Temp(Src) 102.3 F (39.1 C) (Oral)  Resp 20  SpO2 99%   Physical Exam  Constitutional: He is oriented to person, place, and time. He appears well-developed and well-nourished.  HENT:  Head: Normocephalic and atraumatic.  Mouth/Throat: Oropharynx is clear and moist.  Eyes: Conjunctivae and EOM are normal. Pupils are equal, round, and reactive to light.  Neck: Normal range of motion.  Cardiovascular: Normal rate, regular rhythm and normal heart sounds.   Pulmonary/Chest: Effort normal and breath sounds normal. No respiratory distress. He has no wheezes.  Abdominal: Soft. Bowel sounds are normal. There is tenderness. There is guarding and tenderness at McBurney's point. There is no rigidity, no CVA tenderness and negative Murphy's sign.  Abdomen soft, non-distended, focal tenderness at McBurney's point with voluntary guarding  Musculoskeletal: Normal range of motion.  Neurological: He is alert and oriented to person, place, and time.  Skin: Skin is warm and dry.  Psychiatric: He has a normal mood and affect.  Nursing note and vitals reviewed.   ED Course  Procedures (including critical care time) Labs Review Labs Reviewed  CBC WITH DIFFERENTIAL - Abnormal; Notable for the following:    WBC 13.3 (*)    HCT 37.9 (*)    Neutro Abs 9.4 (*)    Monocytes Absolute 1.1 (*)    All other components within normal limits  COMPREHENSIVE  METABOLIC PANEL - Abnormal; Notable for the following:    Calcium 10.7 (*)    Total Protein 9.4 (*)    AST 66 (*)    ALT 84 (*)    Anion gap 18 (*)    All other components within normal limits  URINALYSIS, ROUTINE W REFLEX MICROSCOPIC - Abnormal; Notable for the following:    Color, Urine AMBER (*)    All other components within normal limits  LIPASE, BLOOD    Imaging Review Ct Abdomen Pelvis W  Contrast  01/05/2014   CLINICAL DATA:  Initial evaluation for right lower quadrant pain since 4 a.m., personal history of diverticulitis  EXAM: CT ABDOMEN AND PELVIS WITH CONTRAST  TECHNIQUE: Multidetector CT imaging of the abdomen and pelvis was performed using the standard protocol following bolus administration of intravenous contrast.  CONTRAST:  139mL OMNIPAQUE IOHEXOL 300 MG/ML  SOLN  COMPARISON:  02/07/2005  FINDINGS: Visualized lung bases clear. Visualized portion of the heart normal. No acute musculoskeletal findings.  Mild hepatic steatosis with no focal hepatic abnormalities. Gallbladder, spleen, pancreas, kidneys, and adrenal glands normal. Mild calcification of the aorta.  Stomach, small bowel, and appendix normal. Bladder and reproductive organs normal. No ascites.  Diverticulosis throughout the colon. This includes the cecum. There is moderate inflammatory change around the cecal tip. There is no free air or abscess.  IMPRESSION: Acute diverticulitis of the cecum.   Electronically Signed   By: Skipper Cliche M.D.   On: 01/05/2014 21:48     EKG Interpretation None      MDM   Final diagnoses:  RLQ abdominal pain  Diverticulitis of large intestine without perforation or abscess without bleeding   38 y.o. M presenting with RLQ abdominal pain x 3 days.  On arrival, patient tachycardic and febrile but over non-toxic in appearance.  Abdominal exam with RLQ pain, + mcburney's point tenderness, + heel tap, voluntary guarding.  Will plan for labs and CT abdomen/pelvis with contrast for evaluation of possible appendicitis.  Patient given IVF bolus, morphine, zofran, and tylenol.  Will monitor closely.  After fluids and medications, patient's vital signs have returned to normal. He states he is feeling much better. CT with diverticulitis, no perforation or abscess noted. Patient's pain is well controlled and he is tolerating by mouth without difficulty.  He would like to try outpatient which i  feel is appropriate.  Will discharge home on cipro/flagyl, vicodin, and zofran.  Patient states he is established with GI, Dr. Benson Norway, whom he will follow-up with.  Discussed plan with patient, he/she acknowledged understanding and agreed with plan of care.  Return precautions given for new or worsening symptoms.  Larene Pickett, PA-C 01/06/14 6834  Blanchie Dessert, MD 01/06/14 [phone]

## 2014-01-05 NOTE — Discharge Instructions (Signed)
Take the prescribed medication as directed. Follow-up with Dr. Benson Norway if symptoms worsen. Return to the ED for new or worsening symptoms-- unable to hold down meds, severe pain, high fever, etc.

## 2014-10-17 ENCOUNTER — Other Ambulatory Visit: Payer: Self-pay | Admitting: Family

## 2014-10-17 DIAGNOSIS — R7989 Other specified abnormal findings of blood chemistry: Secondary | ICD-10-CM

## 2014-10-17 DIAGNOSIS — R945 Abnormal results of liver function studies: Principal | ICD-10-CM

## 2014-10-23 ENCOUNTER — Ambulatory Visit
Admission: RE | Admit: 2014-10-23 | Discharge: 2014-10-23 | Disposition: A | Discharge status: Home / Self Care | Payer: 59 | Source: Ambulatory Visit | Attending: Family | Admitting: Family

## 2014-10-23 DIAGNOSIS — R7989 Other specified abnormal findings of blood chemistry: Secondary | ICD-10-CM

## 2014-10-23 DIAGNOSIS — R945 Abnormal results of liver function studies: Principal | ICD-10-CM

## 2015-11-20 IMAGING — CR DG KNEE COMPLETE 4+V*L*
4 series · 4 of 4 positions shown · non-contrast
Comparison: None.

CLINICAL DATA: Left knee joint swelling

EXAM:
LEFT KNEE - COMPLETE 4+ VIEW

[t knee ap left]
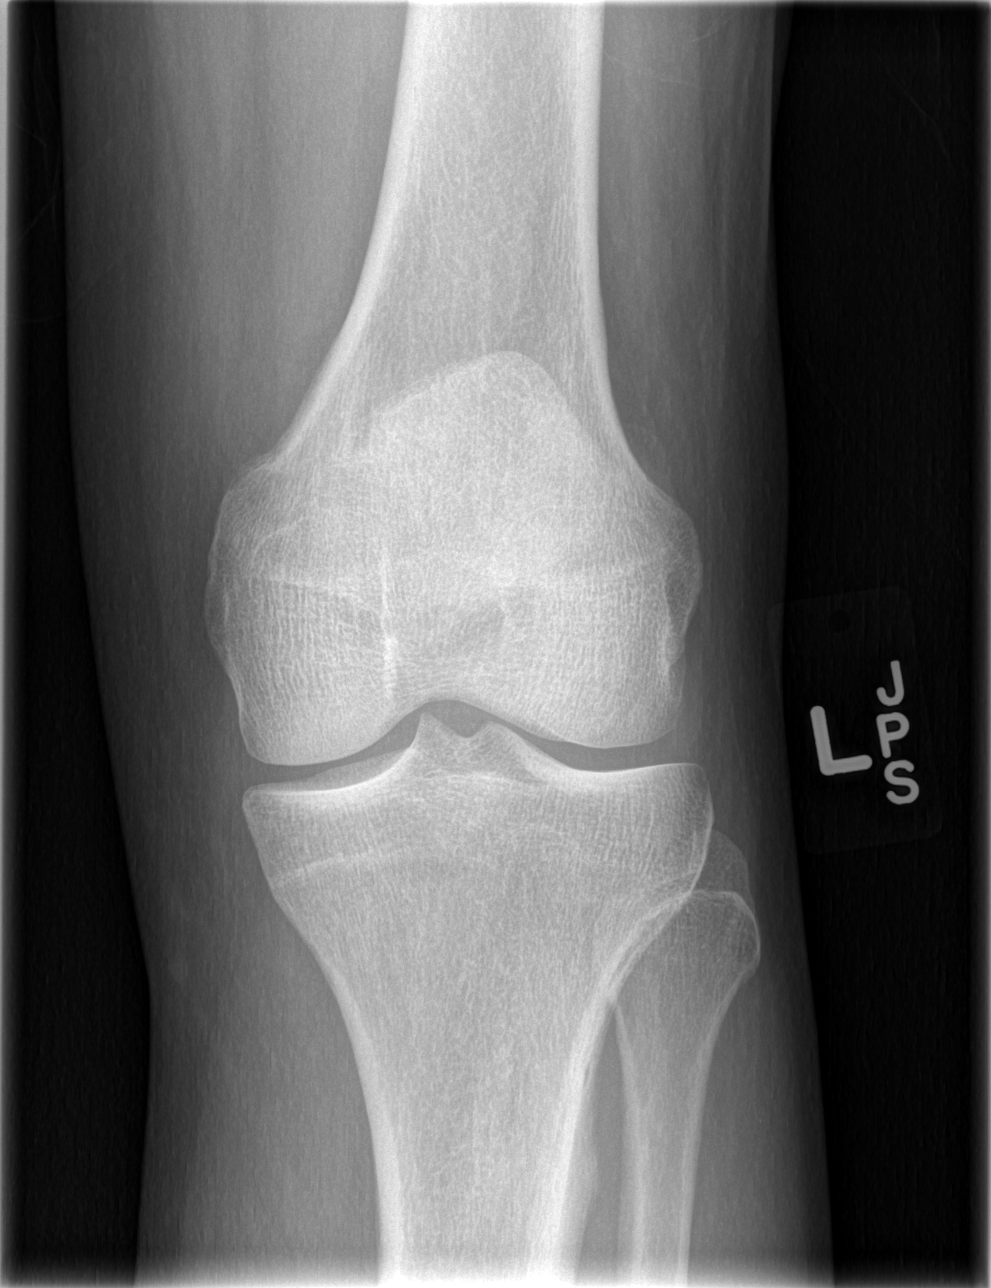

[t knee oblique left (1 of 2)]
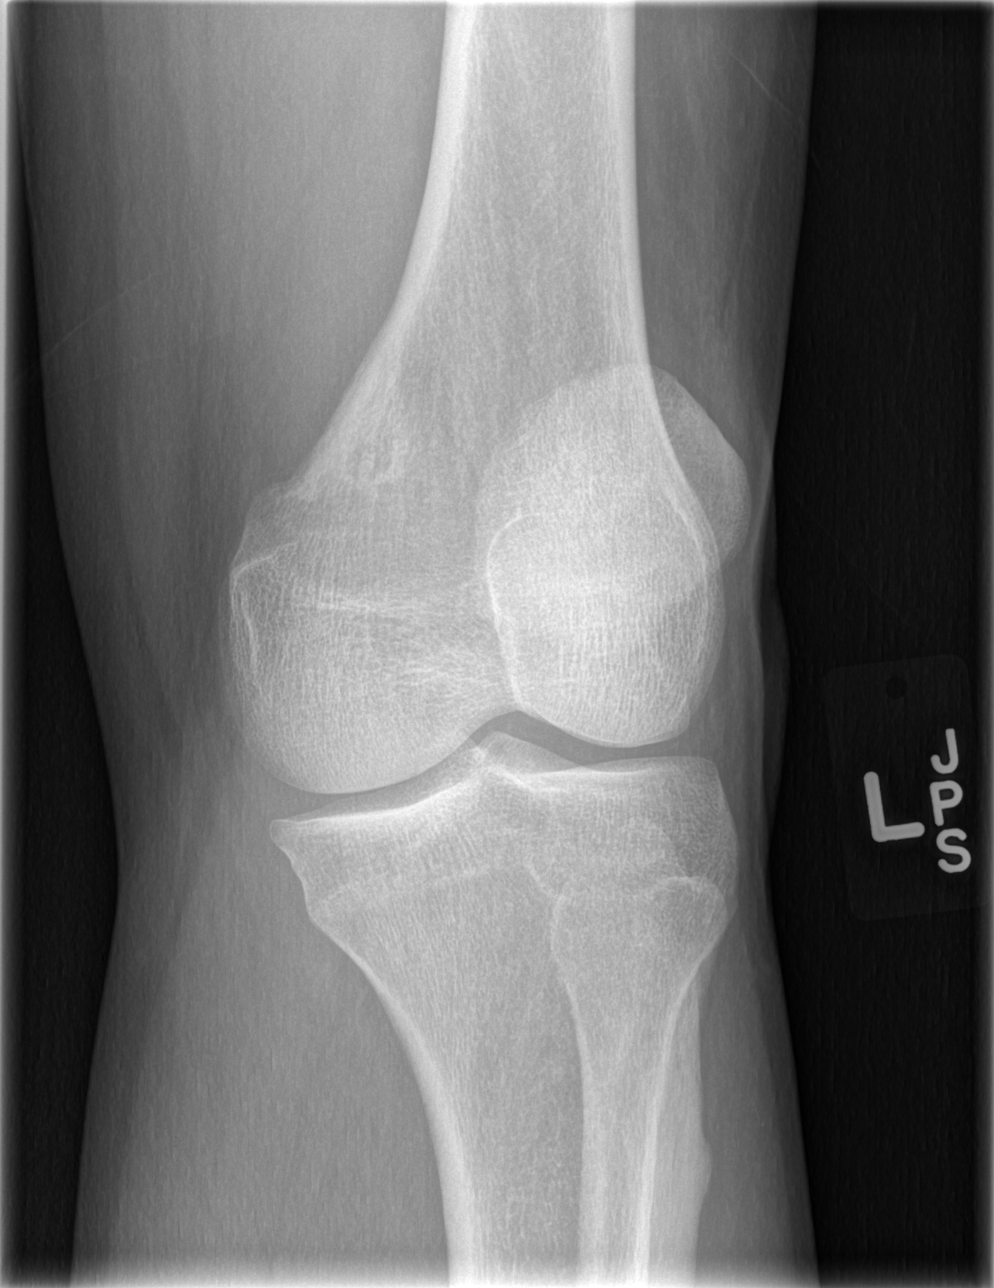

[t knee oblique left (2 of 2)]
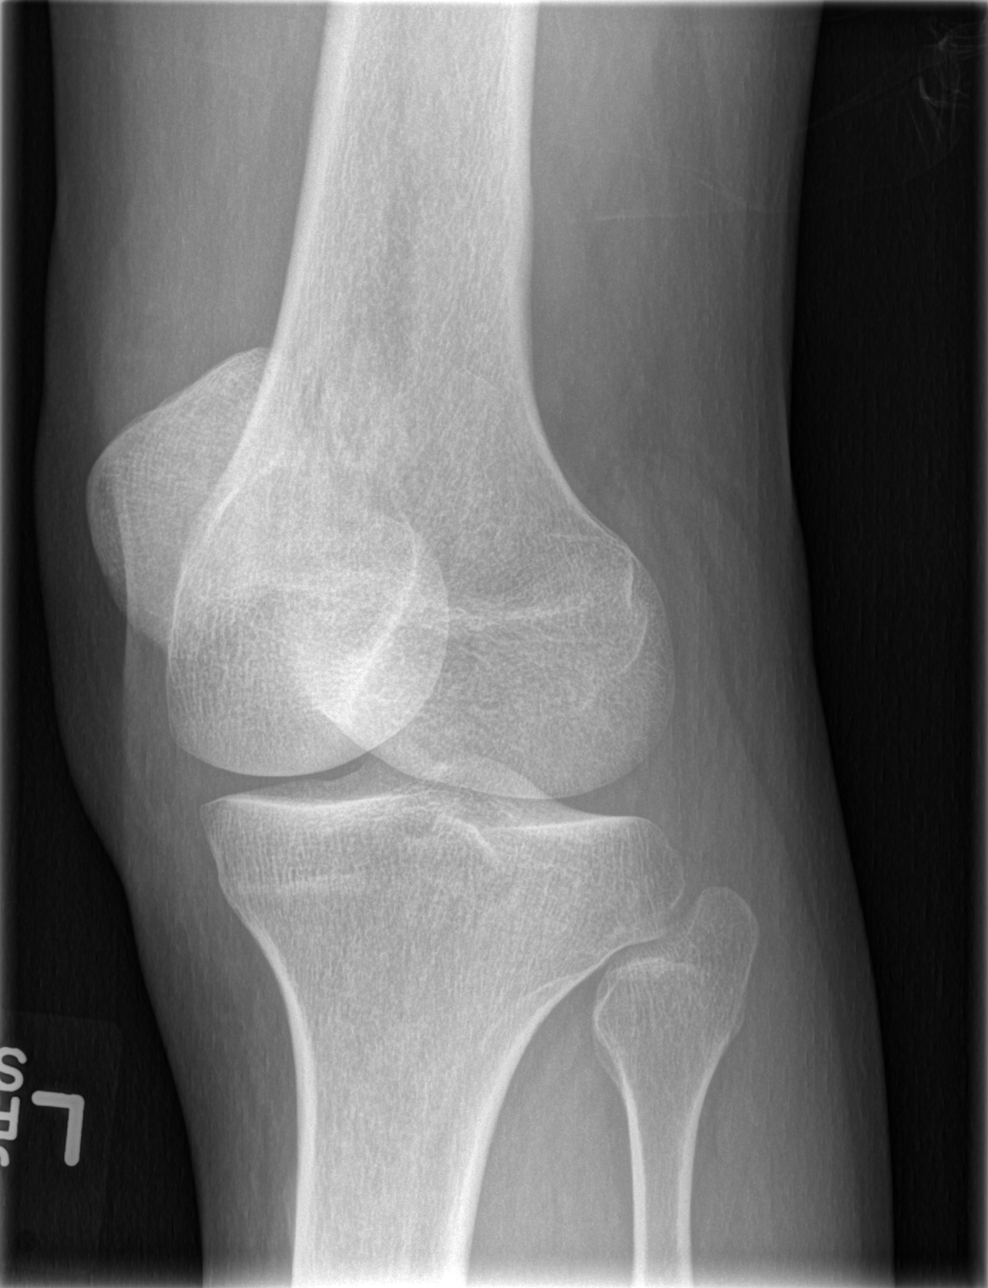

[t knee lat left]
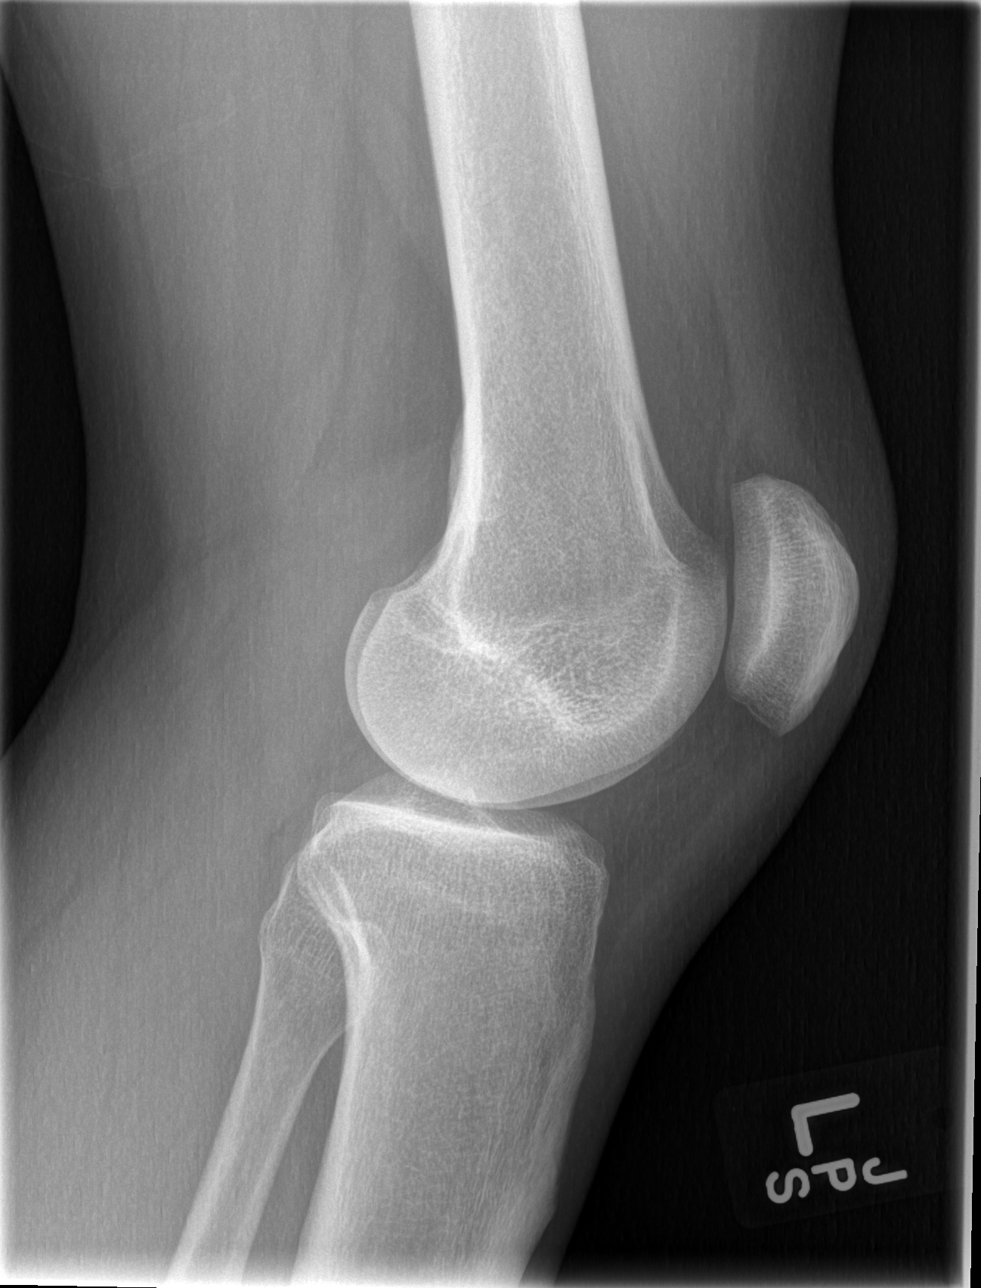

[4 of 4 positions shown; findings below may reference images not displayed]

FINDINGS: There is no evidence of fracture, dislocation, or joint effusion.
There is no evidence of arthropathy or other focal bone abnormality.
Soft tissues are unremarkable.
IMPRESSION: No acute osseous finding

## 2016-03-13 DIAGNOSIS — R7303 Prediabetes: Secondary | ICD-10-CM | POA: Diagnosis not present

## 2016-03-13 DIAGNOSIS — M1A09X0 Idiopathic chronic gout, multiple sites, without tophus (tophi): Secondary | ICD-10-CM | POA: Diagnosis not present

## 2016-04-09 DIAGNOSIS — M109 Gout, unspecified: Secondary | ICD-10-CM | POA: Diagnosis not present

## 2016-04-09 DIAGNOSIS — M25572 Pain in left ankle and joints of left foot: Secondary | ICD-10-CM | POA: Diagnosis not present

## 2016-04-09 DIAGNOSIS — M25571 Pain in right ankle and joints of right foot: Secondary | ICD-10-CM | POA: Diagnosis not present

## 2016-05-04 DIAGNOSIS — M109 Gout, unspecified: Secondary | ICD-10-CM | POA: Diagnosis not present

## 2016-05-04 DIAGNOSIS — M25561 Pain in right knee: Secondary | ICD-10-CM | POA: Diagnosis not present

## 2016-05-04 DIAGNOSIS — M25572 Pain in left ankle and joints of left foot: Secondary | ICD-10-CM | POA: Diagnosis not present

## 2016-07-01 DIAGNOSIS — K760 Fatty (change of) liver, not elsewhere classified: Secondary | ICD-10-CM | POA: Diagnosis not present

## 2016-07-01 DIAGNOSIS — M109 Gout, unspecified: Secondary | ICD-10-CM | POA: Diagnosis not present

## 2016-07-01 DIAGNOSIS — Z23 Encounter for immunization: Secondary | ICD-10-CM | POA: Diagnosis not present

## 2016-07-01 DIAGNOSIS — E119 Type 2 diabetes mellitus without complications: Secondary | ICD-10-CM | POA: Diagnosis not present

## 2016-07-09 DIAGNOSIS — R945 Abnormal results of liver function studies: Secondary | ICD-10-CM | POA: Diagnosis not present

## 2016-07-27 DIAGNOSIS — E119 Type 2 diabetes mellitus without complications: Secondary | ICD-10-CM | POA: Diagnosis not present

## 2016-07-27 DIAGNOSIS — M109 Gout, unspecified: Secondary | ICD-10-CM | POA: Diagnosis not present

## 2016-07-27 DIAGNOSIS — R740 Nonspecific elevation of levels of transaminase and lactic acid dehydrogenase [LDH]: Secondary | ICD-10-CM | POA: Diagnosis not present

## 2016-08-03 ENCOUNTER — Ambulatory Visit: Payer: 59 | Admitting: Dietician

## 2016-08-11 ENCOUNTER — Ambulatory Visit: Payer: 59 | Admitting: Registered"

## 2016-08-31 DIAGNOSIS — M109 Gout, unspecified: Secondary | ICD-10-CM | POA: Diagnosis not present

## 2016-11-09 DIAGNOSIS — D492 Neoplasm of unspecified behavior of bone, soft tissue, and skin: Secondary | ICD-10-CM | POA: Diagnosis not present

## 2017-03-04 DIAGNOSIS — E119 Type 2 diabetes mellitus without complications: Secondary | ICD-10-CM | POA: Diagnosis not present

## 2017-03-04 DIAGNOSIS — E7800 Pure hypercholesterolemia, unspecified: Secondary | ICD-10-CM | POA: Diagnosis not present

## 2017-03-04 DIAGNOSIS — M109 Gout, unspecified: Secondary | ICD-10-CM | POA: Diagnosis not present

## 2017-03-04 DIAGNOSIS — Z125 Encounter for screening for malignant neoplasm of prostate: Secondary | ICD-10-CM | POA: Diagnosis not present

## 2017-09-06 ENCOUNTER — Other Ambulatory Visit: Payer: Self-pay

## 2017-09-06 ENCOUNTER — Emergency Department (HOSPITAL_COMMUNITY)
Admission: EM | Admit: 2017-09-06 | Discharge: 2017-09-06 | Disposition: A | Discharge status: Home / Self Care | Payer: 59 | Attending: Emergency Medicine | Admitting: Emergency Medicine

## 2017-09-06 ENCOUNTER — Encounter (HOSPITAL_COMMUNITY): Payer: Self-pay | Admitting: *Deleted

## 2017-09-06 DIAGNOSIS — I10 Essential (primary) hypertension: Secondary | ICD-10-CM | POA: Diagnosis not present

## 2017-09-06 DIAGNOSIS — Z79899 Other long term (current) drug therapy: Secondary | ICD-10-CM | POA: Diagnosis not present

## 2017-09-06 DIAGNOSIS — R739 Hyperglycemia, unspecified: Secondary | ICD-10-CM

## 2017-09-06 DIAGNOSIS — R0902 Hypoxemia: Secondary | ICD-10-CM | POA: Diagnosis not present

## 2017-09-06 DIAGNOSIS — E119 Type 2 diabetes mellitus without complications: Secondary | ICD-10-CM | POA: Diagnosis not present

## 2017-09-06 DIAGNOSIS — F17210 Nicotine dependence, cigarettes, uncomplicated: Secondary | ICD-10-CM | POA: Diagnosis not present

## 2017-09-06 DIAGNOSIS — R000 Tachycardia, unspecified: Secondary | ICD-10-CM | POA: Diagnosis not present

## 2017-09-06 DIAGNOSIS — R9431 Abnormal electrocardiogram [ECG] [EKG]: Secondary | ICD-10-CM | POA: Diagnosis not present

## 2017-09-06 DIAGNOSIS — E1165 Type 2 diabetes mellitus with hyperglycemia: Secondary | ICD-10-CM | POA: Insufficient documentation

## 2017-09-06 DIAGNOSIS — E7800 Pure hypercholesterolemia, unspecified: Secondary | ICD-10-CM | POA: Diagnosis not present

## 2017-09-06 HISTORY — DX: Essential (primary) hypertension: I10

## 2017-09-06 HISTORY — DX: Unspecified osteoarthritis, unspecified site: M19.90

## 2017-09-06 HISTORY — DX: Type 2 diabetes mellitus without complications: E11.9

## 2017-09-06 LAB — CBC WITH DIFFERENTIAL/PLATELET
Abs Immature Granulocytes: 0 10*3/uL (ref 0.0–0.1)
BASOS ABS: 0 10*3/uL (ref 0.0–0.1)
BASOS PCT: 0 %
EOS PCT: 0 %
Eosinophils Absolute: 0 10*3/uL (ref 0.0–0.7)
HCT: 44.5 % (ref 39.0–52.0)
Hemoglobin: 15.2 g/dL (ref 13.0–17.0)
Immature Granulocytes: 0 %
Lymphocytes Relative: 21 %
Lymphs Abs: 1.6 10*3/uL (ref 0.7–4.0)
MCH: 29.1 pg (ref 26.0–34.0)
MCHC: 34.2 g/dL (ref 30.0–36.0)
MCV: 85.1 fL (ref 78.0–100.0)
MONO ABS: 0.5 10*3/uL (ref 0.1–1.0)
Monocytes Relative: 6 %
Neutro Abs: 5.5 10*3/uL (ref 1.7–7.7)
Neutrophils Relative %: 73 %
Platelets: 264 10*3/uL (ref 150–400)
RBC: 5.23 MIL/uL (ref 4.22–5.81)
RDW: 11.5 % (ref 11.5–15.5)
WBC: 7.7 10*3/uL (ref 4.0–10.5)

## 2017-09-06 LAB — BASIC METABOLIC PANEL
Anion gap: 15 (ref 5–15)
BUN: 12 mg/dL (ref 6–20)
CALCIUM: 10.1 mg/dL (ref 8.9–10.3)
CHLORIDE: 93 mmol/L — AB (ref 98–111)
CO2: 23 mmol/L (ref 22–32)
CREATININE: 1.15 mg/dL (ref 0.61–1.24)
Glucose, Bld: 525 mg/dL (ref 70–99)
Potassium: 4.9 mmol/L (ref 3.5–5.1)
SODIUM: 131 mmol/L — AB (ref 135–145)

## 2017-09-06 LAB — I-STAT CHEM 8, ED
BUN: 14 mg/dL (ref 6–20)
CHLORIDE: 95 mmol/L — AB (ref 98–111)
Calcium, Ion: 1.16 mmol/L (ref 1.15–1.40)
Creatinine, Ser: 0.9 mg/dL (ref 0.61–1.24)
GLUCOSE: 539 mg/dL — AB (ref 70–99)
HCT: 48 % (ref 39.0–52.0)
Hemoglobin: 16.3 g/dL (ref 13.0–17.0)
Potassium: 4.8 mmol/L (ref 3.5–5.1)
SODIUM: 131 mmol/L — AB (ref 135–145)
TCO2: 24 mmol/L (ref 22–32)

## 2017-09-06 LAB — URINALYSIS, ROUTINE W REFLEX MICROSCOPIC
BACTERIA UA: NONE SEEN
Bilirubin Urine: NEGATIVE
Glucose, UA: >=500 mg/dL — AB
Hgb urine dipstick: NEGATIVE
Ketones, ur: 5 mg/dL — AB
Leukocytes, UA: NEGATIVE
Nitrite: NEGATIVE
PH: 5 (ref 5.0–8.0)
Protein, ur: NEGATIVE mg/dL
SPECIFIC GRAVITY, URINE: 1.031 — AB (ref 1.005–1.030)

## 2017-09-06 LAB — CBG MONITORING, ED
Glucose-Capillary: 538 mg/dL (ref 70–99)
Glucose-Capillary: 372 mg/dL — ABNORMAL HIGH (ref 70–99)

## 2017-09-06 MED ORDER — METFORMIN HCL ER (MOD) 1000 MG PO TB24: 1000.0000 mg | ORAL_TABLET | Freq: Every day | ORAL | 0 refills | Status: AC

## 2017-09-06 MED ORDER — INSULIN ASPART 100 UNIT/ML ~~LOC~~ SOLN
10.0000 [IU] | Freq: Once | SUBCUTANEOUS | Status: AC
  Administered 2017-09-06: 10 [IU] via INTRAVENOUS
  Filled 2017-09-06: qty 1

## 2017-09-06 MED ORDER — LACTATED RINGERS IV BOLUS
2000.0000 mL | Freq: Once | INTRAVENOUS | Status: AC
  Administered 2017-09-06: 2000 mL via INTRAVENOUS

## 2017-09-06 NOTE — ED Triage Notes (Signed)
Error in charting at 1254

## 2017-09-06 NOTE — Discharge Instructions (Signed)
Follow up with your PCP.  Take your medication as prescribed.  Return for worsening symptoms.

## 2017-09-06 NOTE — ED Notes (Signed)
Declined W/C at D/C and was escorted to lobby by RN. 

## 2017-09-06 NOTE — ED Triage Notes (Signed)
PT sent from PCP office because of CBG over 500 and Changes in EKG. Pt denies any CP . Pt reports he does not take meds . PCP office gave PT one NGT which gave PT a HA. PT was  Told  One year he was diabetic and was taking Metforman . Pt did not follow up  With MD office.

## 2017-09-06 NOTE — ED Provider Notes (Signed)
[facility] EMERGENCY DEPARTMENT Provider Note   CSN: [id number] Arrival date & time: 09/06/17  8841     History   Chief Complaint Chief Complaint  Patient presents with  . Hyperglycemia    HPI Howard Bird is a 42 y.o. male.  42 yo M with a chief complaint of hyperglycemia.  Patient has a history of diabetes was thought to be borderline.  He was started on metformin but he did not like the way it made him feel and so has not been taking it.  He had a follow-up appointment today with his family doctor and they rechecked his blood sugar noted to be 500.  I also did an EKG.  They felt this is abnormal and sent him to the emergency department.  Patient denied chest pain or shortness of breath denies abdominal pain.  He did endorse some polydipsia polyphagia polyuria.  He otherwise feels that he is well and asymptomatic.  Denies abdominal pain vomiting shortness of breath fatigue.  The history is provided by the patient.  Hyperglycemia  Associated symptoms: increased thirst and polyuria   Associated symptoms: no abdominal pain, no chest pain, no confusion, no fever, no shortness of breath and no vomiting   Illness  This is a new problem. The current episode started less than 1 hour ago. The problem occurs constantly. The problem has not changed since onset.Pertinent negatives include no chest pain, no abdominal pain, no headaches and no shortness of breath. Nothing aggravates the symptoms. Nothing relieves the symptoms. He has tried nothing for the symptoms. The treatment provided no relief.    Past Medical History:  Diagnosis Date  . Arthritis    gout  . Diabetes mellitus without complication ([place])   . Diverticulitis   . Hypertension     There are no active problems to display for this patient.   History reviewed. No pertinent surgical history.      Home Medications    Prior to Admission medications   Medication Sig Start Date End Date Taking?  Authorizing Provider  diphenhydrAMINE HCl (ZZZQUIL) 50 MG/30ML LIQD Take 30 mLs by mouth at bedtime as needed (sleep).   Yes [provider]  indomethacin (INDOCIN) 50 MG capsule Take 50 mg by mouth 3 (three) times daily. Can reduce to 1 capsule twice daily after pain resolves completely. 08/17/17  Yes [provider]  naproxen sodium (ANAPROX) 220 MG tablet Take 220 mg by mouth 2 (two) times daily as needed. For pain   Yes [provider]  HYDROcodone-acetaminophen (NORCO/VICODIN) 5-325 MG per tablet Take 1 tablet by mouth every 4 (four) hours as needed. Patient not taking: Reported on 09/06/2017 01/05/14   Larene Pickett, PA-C  meloxicam (MOBIC) 7.5 MG tablet Take 1-2 tablets daily for pain and swelling for 5 days, then daily as needed for pain. Patient not taking: Reported on 09/06/2017 01/03/14   Noe Gens, PA-C  metFORMIN (GLUMETZA) 1000 MG (MOD) 24 hr tablet Take 1 tablet (1,000 mg total) by mouth daily with breakfast. 09/06/17   Deno Etienne, DO  ondansetron (ZOFRAN) 4 MG tablet Take 1 tablet (4 mg total) by mouth every 6 (six) hours. Patient not taking: Reported on 09/06/2017 01/05/14   Larene Pickett, PA-C  traMADol (ULTRAM) 50 MG tablet Take 1 tablet (50 mg total) by mouth every 6 (six) hours as needed. Patient not taking: Reported on 09/06/2017 01/03/14   Tyrell Antonio    Family History History reviewed. No  pertinent family history.  Social History Social History   Tobacco Use  . Smoking status: Current Every Day Smoker    Packs/day: 0.50  . Smokeless tobacco: Current User  Substance Use Topics  . Alcohol use: Yes  . Drug use: No     Allergies   Oxycontin [oxycodone]   Review of Systems Review of Systems  Constitutional: Negative for chills and fever.  HENT: Negative for congestion and facial swelling.   Eyes: Negative for discharge and visual disturbance.  Respiratory: Negative for shortness of breath.   Cardiovascular: Negative  for chest pain and palpitations.  Gastrointestinal: Negative for abdominal pain, diarrhea and vomiting.  Endocrine: Positive for polydipsia, polyphagia and polyuria.  Musculoskeletal: Negative for arthralgias and myalgias.  Skin: Negative for color change and rash.  Neurological: Negative for tremors, syncope and headaches.  Psychiatric/Behavioral: Negative for confusion and dysphoric mood.     Physical Exam Updated Vital Signs BP (!) 143/111   Pulse 94   Temp 98 F (36.7 C) (Oral)   Resp 20   Ht 5\' 7"  (1.702 m)   Wt 79.4 kg (175 lb)   SpO2 99%   BMI 27.41 kg/m   Physical Exam  Constitutional: He is oriented to person, place, and time. He appears well-developed and well-nourished.  HENT:  Head: Normocephalic and atraumatic.  Eyes: Pupils are equal, round, and reactive to light. EOM are normal.  Neck: Normal range of motion. Neck supple. No JVD present.  Cardiovascular: Normal rate and regular rhythm. Exam reveals no gallop and no friction rub.  No murmur heard. Pulmonary/Chest: No respiratory distress. He has no wheezes.  Abdominal: He exhibits no distension and no mass. There is no tenderness. There is no rebound and no guarding.  Musculoskeletal: Normal range of motion.  Neurological: He is alert and oriented to person, place, and time.  Skin: No rash noted. No pallor.  Psychiatric: He has a normal mood and affect. His behavior is normal.  Nursing note and vitals reviewed.    ED Treatments / Results  Labs (all labs ordered are listed, but only abnormal results are displayed) Labs Reviewed  BASIC METABOLIC PANEL - Abnormal; Notable for the following components:      Result Value   Sodium 131 (*)    Chloride 93 (*)    Glucose, Bld 525 (*)    All other components within normal limits  URINALYSIS, ROUTINE W REFLEX MICROSCOPIC - Abnormal; Notable for the following components:   Color, Urine STRAW (*)    Specific Gravity, Urine 1.031 (*)    Glucose, UA >=500 (*)     Ketones, ur 5 (*)    All other components within normal limits  CBG MONITORING, ED - Abnormal; Notable for the following components:   Glucose-Capillary 538 (*)    All other components within normal limits  I-STAT CHEM 8, ED - Abnormal; Notable for the following components:   Sodium 131 (*)    Chloride 95 (*)    Glucose, Bld 539 (*)    All other components within normal limits  CBG MONITORING, ED - Abnormal; Notable for the following components:   Glucose-Capillary 372 (*)    All other components within normal limits  CBC WITH DIFFERENTIAL/PLATELET    EKG EKG Interpretation  Date/Time:  Monday September 06 2017 10:00:14 EDT Ventricular Rate:  105 PR Interval:    QRS Duration: 95 QT Interval:  318 QTC Calculation: 421 R Axis:   16 Text Interpretation:  Sinus tachycardia Biatrial enlargement  Borderline repolarization abnormality flipped t waves diffusely No old tracing to compare Confirmed by Deno Etienne [phone]) on 09/06/2017 10:03:57 AM   Radiology No results found.  Procedures Procedures (including critical care time)  Medications Ordered in ED Medications  lactated ringers bolus 2,000 mL (0 mLs Intravenous Stopped 09/06/17 1300)  insulin aspart (novoLOG) injection 10 Units (10 Units Intravenous Given 09/06/17 1339)     Initial Impression / Assessment and Plan / ED Course  I have reviewed the triage vital signs and the nursing notes.  Pertinent labs & imaging results that were available during my care of the patient were reviewed by me and considered in my medical decision making (see chart for details).     42 yo M with a chief complaint of hyperglycemia.  Patient is well-appearing and nontoxic.  Initial i-STAT is unlikely to be an DKA with a bicarb of 24.  We will give the patient 2 L of IV fluids reassess.  Patient repeat in the upper 300's, will give a bolus of insulin, start back on metformin PCP follow up.  3:39 PM:  I have discussed the diagnosis/risks/treatment  options with the patient and family and believe the pt to be eligible for discharge home to follow-up with PCP. We also discussed returning to the ED immediately if new or worsening sx occur. We discussed the sx which are most concerning (e.g., sudden worsening pain, fever, inability to tolerate by mouth) that necessitate immediate return. Medications administered to the patient during their visit and any new prescriptions provided to the patient are listed below.  Medications given during this visit Medications  lactated ringers bolus 2,000 mL (0 mLs Intravenous Stopped 09/06/17 1300)  insulin aspart (novoLOG) injection 10 Units (10 Units Intravenous Given 09/06/17 1339)     The patient appears reasonably screen and/or stabilized for discharge and I doubt any other medical condition or other [facility] requiring further screening, evaluation, or treatment in the ED at this time prior to discharge.    Final Clinical Impressions(s) / ED Diagnoses   Final diagnoses:  Hyperglycemia    ED Discharge Orders        Ordered    metFORMIN (GLUMETZA) 1000 MG (MOD) 24 hr tablet  Daily with breakfast     09/06/17 Biggs, [place], DO 09/06/17 1539

## 2017-09-20 DIAGNOSIS — E119 Type 2 diabetes mellitus without complications: Secondary | ICD-10-CM | POA: Diagnosis not present

## 2020-06-13 ENCOUNTER — Emergency Department (HOSPITAL_COMMUNITY): Payer: 59

## 2020-06-13 ENCOUNTER — Other Ambulatory Visit: Payer: Self-pay

## 2020-06-13 ENCOUNTER — Encounter (HOSPITAL_COMMUNITY): Payer: Self-pay

## 2020-06-13 ENCOUNTER — Emergency Department (HOSPITAL_COMMUNITY)
Admission: EM | Admit: 2020-06-13 | Discharge: 2020-06-13 | Disposition: A | Discharge status: Home / Self Care | Payer: 59 | Attending: Emergency Medicine | Admitting: Emergency Medicine

## 2020-06-13 DIAGNOSIS — R109 Unspecified abdominal pain: Secondary | ICD-10-CM | POA: Diagnosis not present

## 2020-06-13 DIAGNOSIS — M10072 Idiopathic gout, left ankle and foot: Secondary | ICD-10-CM | POA: Insufficient documentation

## 2020-06-13 DIAGNOSIS — E119 Type 2 diabetes mellitus without complications: Secondary | ICD-10-CM | POA: Diagnosis not present

## 2020-06-13 DIAGNOSIS — R519 Headache, unspecified: Secondary | ICD-10-CM | POA: Insufficient documentation

## 2020-06-13 DIAGNOSIS — Z7984 Long term (current) use of oral hypoglycemic drugs: Secondary | ICD-10-CM | POA: Insufficient documentation

## 2020-06-13 DIAGNOSIS — M10071 Idiopathic gout, right ankle and foot: Secondary | ICD-10-CM | POA: Insufficient documentation

## 2020-06-13 DIAGNOSIS — F17210 Nicotine dependence, cigarettes, uncomplicated: Secondary | ICD-10-CM | POA: Diagnosis not present

## 2020-06-13 DIAGNOSIS — R059 Cough, unspecified: Secondary | ICD-10-CM | POA: Insufficient documentation

## 2020-06-13 DIAGNOSIS — I10 Essential (primary) hypertension: Secondary | ICD-10-CM | POA: Diagnosis not present

## 2020-06-13 DIAGNOSIS — M109 Gout, unspecified: Secondary | ICD-10-CM

## 2020-06-13 DIAGNOSIS — M25571 Pain in right ankle and joints of right foot: Secondary | ICD-10-CM | POA: Diagnosis present

## 2020-06-13 HISTORY — DX: Gout, unspecified: M10.9

## 2020-06-13 LAB — CBC WITH DIFFERENTIAL/PLATELET
Abs Immature Granulocytes: 0.01 10*3/uL (ref 0.00–0.07)
Basophils Absolute: 0 10*3/uL (ref 0.0–0.1)
Basophils Relative: 0 %
Eosinophils Absolute: 0 10*3/uL (ref 0.0–0.5)
Eosinophils Relative: 0 %
HCT: 41.4 % (ref 39.0–52.0)
Hemoglobin: 14 g/dL (ref 13.0–17.0)
Immature Granulocytes: 0 %
Lymphocytes Relative: 15 %
Lymphs Abs: 1.2 10*3/uL (ref 0.7–4.0)
MCH: 30.5 pg (ref 26.0–34.0)
MCHC: 33.8 g/dL (ref 30.0–36.0)
MCV: 90.2 fL (ref 80.0–100.0)
Monocytes Absolute: 0.6 10*3/uL (ref 0.1–1.0)
Monocytes Relative: 7 %
Neutro Abs: 6.4 10*3/uL (ref 1.7–7.7)
Neutrophils Relative %: 78 %
Platelets: 252 10*3/uL (ref 150–400)
RBC: 4.59 MIL/uL (ref 4.22–5.81)
RDW: 12.2 % (ref 11.5–15.5)
WBC: 8.3 10*3/uL (ref 4.0–10.5)
nRBC: 0 % (ref 0.0–0.2)

## 2020-06-13 LAB — D-DIMER, QUANTITATIVE: D-Dimer, Quant: 3.08 ug/mL-FEU — ABNORMAL HIGH (ref 0.00–0.50)

## 2020-06-13 LAB — BASIC METABOLIC PANEL
Anion gap: 12 (ref 5–15)
BUN: 8 mg/dL (ref 6–20)
CO2: 28 mmol/L (ref 22–32)
Calcium: 9.7 mg/dL (ref 8.9–10.3)
Chloride: 97 mmol/L — ABNORMAL LOW (ref 98–111)
Creatinine, Ser: 0.77 mg/dL (ref 0.61–1.24)
GFR, Estimated: >60 mL/min (ref >60)
Glucose, Bld: 108 mg/dL — ABNORMAL HIGH (ref 70–99)
Potassium: 3.7 mmol/L (ref 3.5–5.1)
Sodium: 137 mmol/L (ref 135–145)

## 2020-06-13 MED ORDER — KETOROLAC TROMETHAMINE 60 MG/2ML IM SOLN
60.0000 mg | Freq: Once | INTRAMUSCULAR | Status: AC
  Administered 2020-06-13: 60 mg via INTRAMUSCULAR
  Filled 2020-06-13: qty 2

## 2020-06-13 MED ORDER — IOHEXOL 350 MG/ML SOLN
100.0000 mL | Freq: Once | INTRAVENOUS | Status: AC | PRN
  Administered 2020-06-13: 75 mL via INTRAVENOUS

## 2020-06-13 MED ORDER — PREDNISONE 10 MG (21) PO TBPK: ORAL_TABLET | Freq: Every day | ORAL | 0 refills | Status: AC

## 2020-06-13 MED ORDER — PREDNISONE 20 MG PO TABS
60.0000 mg | ORAL_TABLET | Freq: Once | ORAL | Status: AC
  Administered 2020-06-13: 60 mg via ORAL
  Filled 2020-06-13: qty 3

## 2020-06-13 NOTE — Discharge Instructions (Addendum)
You came to the emergency department to be evaluated for your gout flare and right flank pain.  Your CT scans showed no blood clot or kidney stone.  Your pain is likely musculoskeletal in nature and should improve over time.  I have given you prescription for lidocaine patch.  You may apply 1 patch to the affected area every 12 hours to help reduce your pain.  You may also attempt ice and/or heat to help with your pain.  I have given you a prescription of prednisone help with your gout flare.  Your gout flare may return after completing this medication so please follow-up with your primary care provider as soon as possible.  Continue taking your indomethacin as prescribed.  I have given you a prescription for steroids today.  Some common side effects include feelings of extra energy, feeling warm, increased appetite, and stomach upset.  If you are diabetic your sugars may run higher than usual.    Get help right away if: You have trouble breathing or you are short of breath. Your abdomen hurts or it is swollen or red. You have nausea or vomiting. You feel faint or you pass out. You have blood in your urine.

## 2020-06-13 NOTE — ED Provider Notes (Signed)
[facility] DEPT Provider Note   CSN: [id number] Arrival date & time: 06/13/20  0845     History Chief Complaint  Patient presents with  . Back Pain  . Ankle Pain    Kerry Chisolm is a 45 y.o. male with a history of gout, hypertension, diabetes mellitus.  Patient presents with a chief complaint of bilateral ankle pain as well as right-sided flank pain.  Patient reports bilateral ankle pain is consistent with his gout flares.  Patient reports he normally has 1-2 flares per month.  Pain is usually controlled with indomethacin.  Patient reports that this pain started on Monday and has progressively worsened since then.  Patient rates his pain 10/10 on the pain scale.  Patient denies any radiation of pain.  Pain is worse with touch or movement.  Patient reports minimal improvement with his at home indomethacin.  Patient reports that he just started prednisone yesterday.  Patient reports that right flank pain started on Tuesday.  Pain has progressively worsened since then.  Patient denies any radiation of pain.  Patient rates his pain 9/10 on the pain scale.  Patient reports pain is worse with movement or inhalation.    Patient endorses cough.  He said his cough is nonproductive and attributes it to his marijuana smoking.  Patient endorses intermittent headache over the last few days, headache is gradual in onset and pain not maximal in onset.  Pain has improved when taking at home Tylenol.  Patient denies any unilateral leg swelling or tenderness, hemoptysis, recent surgery or trauma, history of PE or DVT, hormone use.  Patient denies any fevers, chills, hematuria, dysuria, urinary frequency, urinary urgency, leg swelling.      HPI     Past Medical History:  Diagnosis Date  . Arthritis    gout  . Diabetes mellitus without complication ([place])   . Diverticulitis   . Gout   . Hypertension     There are no problems to display for this  patient.   History reviewed. No pertinent surgical history.     Family History  Problem Relation Age of Onset  . Hypertension Mother     Social History   Tobacco Use  . Smoking status: Current Every Day Smoker    Packs/day: 0.50    Types: Cigarettes  . Smokeless tobacco: Current User  Vaping Use  . Vaping Use: Never used  Substance Use Topics  . Alcohol use: Yes  . Drug use: No    Home Medications Prior to Admission medications   Medication Sig Start Date End Date Taking? Authorizing Provider  diphenhydrAMINE HCl (ZZZQUIL) 50 MG/30ML LIQD Take 30 mLs by mouth at bedtime as needed (sleep).    [provider]  HYDROcodone-acetaminophen (NORCO/VICODIN) 5-325 MG per tablet Take 1 tablet by mouth every 4 (four) hours as needed. Patient not taking: Reported on 09/06/2017 01/05/14   Larene Pickett, PA-C  indomethacin (INDOCIN) 50 MG capsule Take 50 mg by mouth 3 (three) times daily. Can reduce to 1 capsule twice daily after pain resolves completely. 08/17/17   [provider]  meloxicam (MOBIC) 7.5 MG tablet Take 1-2 tablets daily for pain and swelling for 5 days, then daily as needed for pain. Patient not taking: Reported on 09/06/2017 01/03/14   Noe Gens, PA-C  metFORMIN (GLUMETZA) 1000 MG (MOD) 24 hr tablet Take 1 tablet (1,000 mg total) by mouth daily with breakfast. 09/06/17   Deno Etienne, DO  naproxen sodium (ANAPROX) 220 MG  tablet Take 220 mg by mouth 2 (two) times daily as needed. For pain    [provider]  ondansetron (ZOFRAN) 4 MG tablet Take 1 tablet (4 mg total) by mouth every 6 (six) hours. Patient not taking: Reported on 09/06/2017 01/05/14   Larene Pickett, PA-C  traMADol (ULTRAM) 50 MG tablet Take 1 tablet (50 mg total) by mouth every 6 (six) hours as needed. Patient not taking: Reported on 09/06/2017 01/03/14   Noe Gens, PA-C    Allergies    Oxycontin [oxycodone]  Review of Systems   Review of Systems  Constitutional:  Negative for chills and fever.  HENT: Negative for congestion, rhinorrhea and sore throat.   Eyes: Negative for visual disturbance.  Respiratory: Positive for cough. Negative for shortness of breath.   Cardiovascular: Negative for chest pain.  Gastrointestinal: Negative for abdominal pain, nausea and vomiting.  Genitourinary: Positive for flank pain. Negative for decreased urine volume, difficulty urinating, dysuria, frequency, genital sores, hematuria, penile discharge, penile pain, penile swelling, scrotal swelling, testicular pain and urgency.  Musculoskeletal: Positive for arthralgias, joint swelling and myalgias. Negative for back pain, neck pain and neck stiffness.  Skin: Negative for color change and rash.  Neurological: Positive for headaches. Negative for dizziness, tremors, seizures, syncope, facial asymmetry, speech difficulty, weakness, light-headedness and numbness.  Psychiatric/Behavioral: Negative for confusion.    Physical Exam Updated Vital Signs BP (!) 157/101 (BP Location: Left Arm)   Pulse 94   Temp 98.3 F (36.8 C) (Oral)   Resp 18   Ht 5\' 7"  (1.702 m)   Wt 77.1 kg   SpO2 100%   BMI 26.63 kg/m   Physical Exam Vitals and nursing note reviewed.  Constitutional:      General: He is not in acute distress.    Appearance: He is not ill-appearing, toxic-appearing or diaphoretic.  HENT:     Head: Normocephalic.  Eyes:     General: No scleral icterus.       Right eye: No discharge.        Left eye: No discharge.  Cardiovascular:     Rate and Rhythm: Normal rate.     Pulses:          Dorsalis pedis pulses are 3+ on the right side and 3+ on the left side.     Heart sounds: Normal heart sounds.  Pulmonary:     Effort: Pulmonary effort is normal. No respiratory distress.     Breath sounds: Normal breath sounds. No stridor.  Abdominal:     General: Abdomen is protuberant. Bowel sounds are normal. There is no distension. There are no signs of injury.      Palpations: Abdomen is soft. There is no mass or pulsatile mass.     Tenderness: There is no abdominal tenderness. There is right CVA tenderness. There is no guarding or rebound.     Hernia: There is no hernia in the umbilical area or ventral area.  Musculoskeletal:     Cervical back: Neck supple.     Right lower leg: No swelling, deformity, lacerations, tenderness or bony tenderness. No edema.     Left lower leg: No swelling, deformity, lacerations, tenderness or bony tenderness. No edema.     Right ankle: Swelling present. No deformity, ecchymosis or lacerations. Tenderness present. Normal range of motion.     Right Achilles Tendon: No tenderness or defects.     Left ankle: Swelling present. No deformity, ecchymosis or lacerations. Tenderness present. Normal range of  motion.     Left Achilles Tendon: No tenderness or defects.     Comments: No midline tenderness, deformity, or step-off to cervical, thoracic, or lumbar spine  Diffuse tenderness throughout bilateral ankles; swelling to right ankle greater than left  Feet:     Right foot:     Skin integrity: Erythema and dry skin present. No ulcer, blister, skin breakdown, warmth, callus or fissure.     Toenail Condition: Right toenails are abnormally thick.     Left foot:     Skin integrity: Dry skin present. No ulcer, blister, skin breakdown, erythema, warmth, callus or fissure.     Toenail Condition: Left toenails are abnormally thick.  Skin:    General: Skin is warm and dry.  Neurological:     General: No focal deficit present.     Mental Status: He is alert.  Psychiatric:        Behavior: Behavior is cooperative.     ED Results / Procedures / Treatments   Labs (all labs ordered are listed, but only abnormal results are displayed) Labs Reviewed  BASIC METABOLIC PANEL - Abnormal; Notable for the following components:      Result Value   Chloride 97 (*)    Glucose, Bld 108 (*)    All other components within normal limits   D-DIMER, QUANTITATIVE - Abnormal; Notable for the following components:   D-Dimer, Quant 3.08 (*)    All other components within normal limits  CBC WITH DIFFERENTIAL/PLATELET    EKG None  Radiology DG Chest 2 View  Result Date: 06/13/2020 CLINICAL DATA:  Cough. Flank pain. EXAM: CHEST - 2 VIEW COMPARISON:  None. FINDINGS: Normal sized heart. Clear lungs with normal vascularity. Mild peribronchial thickening. Unremarkable bones. IMPRESSION: Mild bronchitic changes. Electronically Signed   By: Claudie Revering M.D.   On: 06/13/2020 10:25   CT Angio Chest PE W and/or Wo Contrast  Result Date: 06/13/2020 CLINICAL DATA:  PE suspected, mid back to side pain EXAM: CT ANGIOGRAPHY CHEST WITH CONTRAST TECHNIQUE: Multidetector CT imaging of the chest was performed using the standard protocol during bolus administration of intravenous contrast. Multiplanar CT image reconstructions and MIPs were obtained to evaluate the vascular anatomy. CONTRAST:  65mL OMNIPAQUE IOHEXOL 350 MG/ML SOLN COMPARISON:  None. FINDINGS: Cardiovascular: Satisfactory opacification of the pulmonary arteries to the segmental level. No evidence of pulmonary embolism. Top-normal heart size. No pericardial effusion. Mediastinum/Nodes: No enlarged lymph nodes. Thyroid and esophagus are unremarkable. Lungs/Pleura: Scattered paraseptal emphysema. Small cavity of the left upper lobe may reflect pneumatocele. No consolidation. No pleural effusion or pneumothorax. Upper Abdomen: No acute abnormality. Musculoskeletal: No chest wall abnormality. No acute osseous abnormality Review of the MIP images confirms the above findings. IMPRESSION: No acute pulmonary embolism or other acute abnormality. Is is cardiomegaly. Electronically Signed   By: Macy Mis M.D.   On: 06/13/2020 12:02   CT Renal Stone Study  Result Date: 06/13/2020 CLINICAL DATA:  Left flank pain with kidney stone suspected. Symptoms for 3 days. EXAM: CT ABDOMEN AND PELVIS WITHOUT  CONTRAST TECHNIQUE: Multidetector CT imaging of the abdomen and pelvis was performed following the standard protocol without IV contrast. COMPARISON:  01/05/2014 FINDINGS: Lower chest:  No contributory findings. Hepatobiliary: No focal liver abnormality.No evidence of biliary obstruction or stone. Pancreas: Unremarkable. Spleen: Unremarkable. Adrenals/Urinary Tract: Negative adrenals. No hydronephrosis or stone. Perinephric stranding which is symmetric and chronic when compared to prior. Unremarkable bladder. Stomach/Bowel: Extensive colonic diverticulosis. No bowel obstruction. No visible inflammation including  diverticulitis. Vascular/Lymphatic: Atheromatous calcification of the aorta and iliacs which is notable for age. No mass or adenopathy. Reproductive:No pathologic findings. Other: No ascites or pneumoperitoneum. Musculoskeletal: No acute abnormalities. IMPRESSION: 1. No acute finding.  No hydronephrosis or nephrolithiasis. 2. Colonic diverticulosis. 3. Aortic atherosclerosis. Electronically Signed   By: Monte Fantasia M.D.   On: 06/13/2020 10:28    Procedures Procedures   Medications Ordered in ED Medications  ketorolac (TORADOL) injection 60 mg (60 mg Intramuscular Given 06/13/20 1102)  predniSONE (DELTASONE) tablet 60 mg (60 mg Oral Given 06/13/20 1102)  iohexol (OMNIPAQUE) 350 MG/ML injection 100 mL (75 mLs Intravenous Contrast Given 06/13/20 1129)    ED Course  I have reviewed the triage vital signs and the nursing notes.  Pertinent labs & imaging results that were available during my care of the patient were reviewed by me and considered in my medical decision making (see chart for details).    MDM Rules/Calculators/A&P                          Alert 45 year old male in no acute distress, nontoxic appearing.  Patient presents with chief complaint of ankle pain and right flank pain.  Patient reports that his bilateral ankle pain is consistent with his gout flares.  Patient reports 1-2  flares per month.  Patient reports that symptoms are not being controlled with his at home indomethacin.  Patient denies any fevers or chills.    Patient also complains of right flank pain.  Flank pain began on Tuesday and has progressively worsened since then.  Pain is worse when taking deep inhalation or with movement.  Patient denies any urinary complaints.  On physical exam patient noted to have diffuse tenderness throughout bilateral ankles, diffuse swelling to both medial and lateral aspects of bilateral ankles.  Swelling in right foot is greater than left.  +3 dorsalis pedis pulse bilaterally.  Patient has full range of motion to bilateral ankles.  Erythema noted to dorsal aspect of bilateral feet. Lungs clear to auscultation bilaterally.  Right CVA tenderness.  Abdomen soft, nondistended, nontender.  Low suspicion for PE, will obtain D-dimer due to pleuritic nature of pain.  If positive will obtain CTA chest to evaluate for PE causing patient's right flank pain.  Will obtain CT renal study to evaluate for possible right calculi causing patient's right flank pain and CVA tenderness.  Will obtain chest x-ray, BMP, CBC. BMP and CBC are unremarkable. CXR shows Mild bronchitic changes.  CT RENAL study shows:  1. No acute finding.  No hydronephrosis or nephrolithiasis. 2. Colonic diverticulosis. 3. Aortic atherosclerosis.  D-Dimer found to be elevated at 3.08.  Will obtain CTA chest CTA chest shows: No acute pulmonary embolism or other acute abnormality.  Patient's right flank pain likely musculoskeletal in nature.  Patient advised to use OTC pain meds, ice and or heat to help with his symptoms.    Patient reports improvement in his pain after receiving Toradol.  Patient was given prednisone due to gout flare.  Will prescribe patient with prednisone taper his his gout flare. Patient advised to follow up with his primary care provider.   Discussed results, findings, treatment and follow up.  Patient advised of return precautions. Patient verbalized understanding and agreed with plan.   Final Clinical Impression(s) / ED Diagnoses Final diagnoses:  Right flank pain  Acute gout of ankle, unspecified cause, unspecified laterality    Rx / DC Orders ED Discharge  Orders         Ordered    predniSONE (STERAPRED UNI-PAK 21 TAB) 10 MG (21) TBPK tablet  Daily        06/13/20 1236           Dyann Ruddle 06/13/20 2222    Lacretia Leigh, MD 06/18/20 1924

## 2020-06-13 NOTE — ED Triage Notes (Signed)
Patient c/o gout pain and swelling in bilateral ankles. R>L. patient states his gout medicine is not working.  Patient c/o mid back pain x 3 days. Patient denies any injury or heavy lifting.
